# Patient Record
Sex: Female | Born: 1960 | Marital: Married | Smoking: Never smoker
Health system: Southern US, Community
[De-identification: ages and names within clinical notes are randomized; demographics above are authoritative.]

## PROBLEM LIST (undated history)

## (undated) DIAGNOSIS — I2699 Other pulmonary embolism without acute cor pulmonale: Secondary | ICD-10-CM

## (undated) DIAGNOSIS — T7840XA Allergy, unspecified, initial encounter: Secondary | ICD-10-CM

## (undated) DIAGNOSIS — I82409 Acute embolism and thrombosis of unspecified deep veins of unspecified lower extremity: Secondary | ICD-10-CM

## (undated) DIAGNOSIS — G43109 Migraine with aura, not intractable, without status migrainosus: Secondary | ICD-10-CM

## (undated) HISTORY — DX: Migraine with aura, not intractable, without status migrainosus: G43.109

## (undated) HISTORY — PX: TUBAL LIGATION: SHX77

## (undated) HISTORY — PX: COLONOSCOPY: SHX174

## (undated) HISTORY — DX: Allergy, unspecified, initial encounter: T78.40XA

---

## 1996-06-19 DIAGNOSIS — G43109 Migraine with aura, not intractable, without status migrainosus: Secondary | ICD-10-CM

## 1996-06-19 HISTORY — DX: Migraine with aura, not intractable, without status migrainosus: G43.109

## 2003-06-20 LAB — HM MAMMOGRAPHY: HM Mammogram: NORMAL

## 2004-04-07 ENCOUNTER — Other Ambulatory Visit: Admission: RE | Admit: 2004-04-07 | Discharge: 2004-04-07 | Payer: Self-pay | Admitting: Gynecology

## 2006-06-19 HISTORY — PX: NEPHRECTOMY: SHX65

## 2011-01-26 ENCOUNTER — Ambulatory Visit (INDEPENDENT_AMBULATORY_CARE_PROVIDER_SITE_OTHER): Payer: Self-pay | Admitting: Internal Medicine

## 2011-01-26 ENCOUNTER — Encounter: Payer: Self-pay | Admitting: Internal Medicine

## 2011-01-26 ENCOUNTER — Other Ambulatory Visit (INDEPENDENT_AMBULATORY_CARE_PROVIDER_SITE_OTHER): Payer: Self-pay

## 2011-01-26 VITALS — BP 116/70 | HR 86 | Temp 98.6°F | Resp 16 | Ht 61.0 in | Wt 161.0 lb

## 2011-01-26 DIAGNOSIS — G43909 Migraine, unspecified, not intractable, without status migrainosus: Secondary | ICD-10-CM

## 2011-01-26 DIAGNOSIS — R5383 Other fatigue: Secondary | ICD-10-CM | POA: Insufficient documentation

## 2011-01-26 LAB — CBC WITH DIFFERENTIAL/PLATELET
Basophils Absolute: 0 10*3/uL (ref 0.0–0.1)
Eosinophils Absolute: 0.4 10*3/uL (ref 0.0–0.7)
Hemoglobin: 11.6 g/dL — ABNORMAL LOW (ref 12.0–15.0)
Lymphocytes Relative: 30.3 % (ref 12.0–46.0)
Lymphs Abs: 2.3 10*3/uL (ref 0.7–4.0)
MCHC: 32.5 g/dL (ref 30.0–36.0)
Neutro Abs: 4.1 10*3/uL (ref 1.4–7.7)
RDW: 14.1 % (ref 11.5–14.6)

## 2011-01-26 LAB — COMPREHENSIVE METABOLIC PANEL
AST: 16 U/L (ref 0–37)
Alkaline Phosphatase: 44 U/L (ref 39–117)
BUN: 12 mg/dL (ref 6–23)
Creatinine, Ser: 1 mg/dL (ref 0.4–1.2)
Potassium: 3.8 mEq/L (ref 3.5–5.1)
Total Bilirubin: 0.4 mg/dL (ref 0.3–1.2)

## 2011-01-26 MED ORDER — ELETRIPTAN HYDROBROMIDE 40 MG PO TABS: 40.0000 mg | ORAL_TABLET | ORAL | Status: DC | PRN

## 2011-01-26 NOTE — Patient Instructions (Signed)
Migraine Headache   A migraine headache is an intense, throbbing pain on one or both sides of your head. The exact cause of a migraine headache is not always known. A migraine may be caused when nerves in the brain become irritated and release chemicals that cause swelling (inflammation) within blood vessels, causing pain. Many migraine sufferers have a family history of migraines. Before you get a migraine you may or may not get an aura. An aura is a group of symptoms that can predict the beginning of a migraine. An aura may include:   Visual changes such as:   Flashing lights.   Seeing bright spots or zig-zag lines.   Tunnel vision.   Feelings of numbness.   Trouble talking.   Muscle weakness.   SYMPTOMS OF A MIGRAINE   A migraine headache has one or more of the following symptoms:   Pain on one or both sides of your head.   Pain that is pulsating or throbbing in nature.   Pain that is severe enough to prevent daily activities.   Pain that is aggravated by any daily physical activity.   Nausea (feeling sick to your stomach), vomiting or both.   Pain with exposure to bright lights, loud noises or activity.   General sensitivity to bright lights or loud noises.   MIGRAINE TRIGGERS   A migraine headache can be triggered by many things. Examples of triggers include:   Alcohol.   Smoking.   Stress.   It may be related to menses (female menstruation).   Aged cheeses.   Foods or drinks that contain nitrates, glutamate, aspartame or tyramine.   Lack of sleep.   Chocolate.   Caffeine.   Hunger.   Medications such as nitroglycerine (used to treat chest pain), birth control pills, estrogen and some blood pressure medications.   DIAGNOSIS   A migraine headache is often diagnosed based on:   Your symptoms.   Physical examination.   A CT scan of your head may be ordered to see if your headaches are caused from other medical conditions.   HOME CARE INSTRUCTIONS   Medications can help prevent migraines if they are recurrent or  should they become recurrent. Your caregiver can help you with a medication or treatment program that will be helpful to you.   If you get a migraine, it may be helpful to lie down in a dark, quiet room.   It may be helpful to keep a headache diary. This may help you find a trend as to what may be triggering your headaches.   SEEK IMMEDIATE MEDICAL CARE IF:   You do not get relief from the medications given to you or you have a recurrence of pain.   You have confusion, personality changes or seizures.   You have headaches that wake you from sleep.   You have an increased frequency in your headaches.   You have a stiff neck.   You have a loss of vision.   You have muscle weakness.   You start losing your balance or have trouble walking.   You feel faint or pass out.   MAKE SURE YOU:   Understand these instructions.   Will watch your condition.   Will get help right away if you are not doing well or get worse.   Document Released: 06/05/2005 Document Re-Released: 04/02/2009   [employer]® Patient Information ©2011 [employer].

## 2011-01-27 ENCOUNTER — Encounter: Payer: Self-pay | Admitting: Internal Medicine

## 2011-01-27 NOTE — Assessment & Plan Note (Signed)
Try relpax 

## 2011-01-27 NOTE — Assessment & Plan Note (Signed)
I will check labs to look for secondary causes 

## 2011-01-27 NOTE — Progress Notes (Signed)
Subjective:    Patient ID: Christy [name], female    DOB: 02-07-1961, 50 y.o.   MRN: [id number]  Headache  This is a chronic problem. The current episode started more than 1 year ago. The problem occurs intermittently. The problem has been unchanged. The pain is located in the bilateral region. The pain does not radiate. The pain quality is similar to prior headaches. The quality of the pain is described as pulsating. The pain is at a severity of 7/10. The pain is moderate. Pertinent negatives include no abdominal pain, abnormal behavior, anorexia, back pain, blurred vision, coughing, dizziness, drainage, ear pain, eye pain, eye redness, eye watering, facial sweating, fever, hearing loss, insomnia, loss of balance, muscle aches, nausea, neck pain, numbness, phonophobia, photophobia, rhinorrhea, scalp tenderness, seizures, sinus pressure, sore throat, swollen glands, tingling, tinnitus, visual change, vomiting, weakness or weight loss. The symptoms are aggravated by weather changes and emotional stress. She has tried triptans for the symptoms. The treatment provided significant relief. Her past medical history is significant for migraine headaches, migraines in the family and obesity. There is no history of cancer, cluster headaches, hypertension, immunosuppression, pseudotumor cerebri, recent head traumas, sinus disease or TMJ.      Review of Systems  Constitutional: Positive for fatigue and unexpected weight change (weight gain). Negative for fever, chills, weight loss, diaphoresis, activity change and appetite change.  HENT: Negative for hearing loss, ear pain, nosebleeds, congestion, sore throat, facial swelling, rhinorrhea, sneezing, drooling, mouth sores, trouble swallowing, neck pain, neck stiffness, dental problem, voice change, postnasal drip, sinus pressure, tinnitus and ear discharge.   Eyes: Negative for blurred vision, photophobia, pain, discharge, redness, itching and visual disturbance.    Respiratory: Negative for apnea, cough, choking, chest tightness, shortness of breath, wheezing and stridor.   Cardiovascular: Negative for chest pain, palpitations and leg swelling.  Gastrointestinal: Positive for diarrhea and constipation. Negative for nausea, vomiting, abdominal pain, abdominal distention, rectal pain and anorexia.  Genitourinary: Negative for dysuria, urgency, frequency, hematuria, flank pain, vaginal bleeding, enuresis, difficulty urinating and dyspareunia.  Musculoskeletal: Negative for myalgias, back pain, joint swelling, arthralgias and gait problem.  Skin: Negative for color change, pallor, rash and wound.  Neurological: Positive for headaches. Negative for dizziness, tingling, tremors, seizures, syncope, facial asymmetry, speech difficulty, weakness, light-headedness, numbness and loss of balance.  Hematological: Negative for adenopathy. Does not bruise/bleed easily.  Psychiatric/Behavioral: Negative.  The patient does not have insomnia.        Objective:   Physical Exam  Vitals reviewed. Constitutional: She appears well-developed and well-nourished. No distress.  HENT:  Head: Normocephalic and atraumatic.  Right Ear: External ear normal.  Left Ear: External ear normal.  Nose: Nose normal.  Mouth/Throat: Oropharynx is clear and moist. No oropharyngeal exudate.  Eyes: Conjunctivae and EOM are normal. Pupils are equal, round, and reactive to light. No scleral icterus.  Neck: Normal range of motion. Neck supple. No JVD present. No tracheal deviation present. No thyromegaly present.  Cardiovascular: Normal rate, regular rhythm, normal heart sounds and intact distal pulses.  Exam reveals no gallop and no friction rub.   No murmur heard. Pulmonary/Chest: Effort normal and breath sounds normal. No stridor. No respiratory distress. She has no wheezes. She has no rales. She exhibits no tenderness.  Abdominal: Soft. Bowel sounds are normal. She exhibits no distension and  no mass. There is no tenderness. There is no rebound and no guarding.  Musculoskeletal: Normal range of motion. She exhibits no edema and no tenderness.  Lymphadenopathy:  She has no cervical adenopathy.  Neurological: She is alert. She has normal strength. She displays no atrophy, no tremor and normal reflexes. No cranial nerve deficit or sensory deficit. She exhibits normal muscle tone. She displays a negative Romberg sign. She displays no seizure activity. Coordination and gait normal.  Reflex Scores:      Tricep reflexes are 1+ on the right side and 1+ on the left side.      Bicep reflexes are 1+ on the right side and 1+ on the left side.      Brachioradialis reflexes are 1+ on the right side and 1+ on the left side.      Patellar reflexes are 1+ on the right side and 1+ on the left side.      Achilles reflexes are 1+ on the right side and 1+ on the left side. Skin: Skin is warm and dry. No rash noted. She is not diaphoretic. No erythema. No pallor.  Psychiatric: She has a normal mood and affect. Her behavior is normal. Judgment and thought content normal.          Assessment & Plan:

## 2011-01-28 ENCOUNTER — Encounter: Payer: Self-pay | Admitting: Internal Medicine

## 2011-05-23 ENCOUNTER — Encounter: Payer: Self-pay | Admitting: Internal Medicine

## 2011-05-23 ENCOUNTER — Ambulatory Visit (INDEPENDENT_AMBULATORY_CARE_PROVIDER_SITE_OTHER): Payer: BC Managed Care – PPO | Admitting: Internal Medicine

## 2011-05-23 VITALS — BP 114/68 | HR 88 | Temp 98.2°F | Resp 16 | Wt 165.0 lb

## 2011-05-23 DIAGNOSIS — J209 Acute bronchitis, unspecified: Secondary | ICD-10-CM | POA: Insufficient documentation

## 2011-05-23 MED ORDER — MOXIFLOXACIN HCL 400 MG PO TABS: 400.0000 mg | ORAL_TABLET | Freq: Every day | ORAL | Status: AC

## 2011-05-23 MED ORDER — GUAIFENESIN-CODEINE 100-10 MG/5ML PO SYRP: 5.0000 mL | ORAL_SOLUTION | Freq: Three times a day (TID) | ORAL | Status: AC | PRN

## 2011-05-23 NOTE — Assessment & Plan Note (Signed)
Start avelox for the infection and robitussin AC for the cough

## 2011-05-23 NOTE — Progress Notes (Signed)
  Subjective:    Patient ID: Christy [name], female    DOB: May 19, 1961, 50 y.o.   MRN: [id number]  Cough This is a new problem. Episode onset: for 4 weeks. The problem has been gradually worsening. The problem occurs every few hours. The cough is productive of purulent sputum. Associated symptoms include chills. Pertinent negatives include no chest pain, ear congestion, ear pain, fever, headaches, heartburn, hemoptysis, myalgias, nasal congestion, postnasal drip, rash, rhinorrhea, sore throat, shortness of breath, sweats, weight loss or wheezing. The symptoms are aggravated by nothing. She has tried OTC cough suppressant for the symptoms. The treatment provided no relief.      Review of Systems  Constitutional: Positive for chills. Negative for fever, weight loss, diaphoresis, activity change, appetite change, fatigue and unexpected weight change.  HENT: Negative for ear pain, sore throat, rhinorrhea, trouble swallowing, voice change, postnasal drip and sinus pressure.   Eyes: Negative.   Respiratory: Positive for cough. Negative for hemoptysis, shortness of breath, wheezing and stridor.   Cardiovascular: Negative for chest pain, palpitations and leg swelling.  Gastrointestinal: Negative for heartburn, nausea, abdominal pain, diarrhea, constipation, blood in stool and abdominal distention.  Genitourinary: Negative for dysuria, urgency, frequency, hematuria, decreased urine volume, enuresis, difficulty urinating and dyspareunia.  Musculoskeletal: Negative for myalgias, back pain, joint swelling, arthralgias and gait problem.  Skin: Negative for color change, pallor, rash and wound.  Neurological: Negative for dizziness, tremors, seizures, syncope, facial asymmetry, speech difficulty, weakness, light-headedness, numbness and headaches.  Hematological: Negative for adenopathy. Does not bruise/bleed easily.  Psychiatric/Behavioral: Negative.        Objective:   Physical Exam  Vitals  reviewed. Constitutional: She is oriented to person, place, and time. She appears well-developed and well-nourished.  HENT:  Head: Normocephalic and atraumatic.  Mouth/Throat: Oropharynx is clear and moist. No oropharyngeal exudate.  Eyes: Conjunctivae are normal. Right eye exhibits no discharge. Left eye exhibits no discharge. No scleral icterus.  Neck: Normal range of motion. Neck supple. No JVD present. No tracheal deviation present. No thyromegaly present.  Cardiovascular: Normal rate, regular rhythm, normal heart sounds and intact distal pulses.  Exam reveals no gallop and no friction rub.   No murmur heard. Pulmonary/Chest: Effort normal and breath sounds normal. No stridor. No respiratory distress. She has no wheezes. She has no rales. She exhibits no tenderness.  Abdominal: Soft. Bowel sounds are normal. She exhibits no distension and no mass. There is no tenderness. There is no rebound and no guarding.  Musculoskeletal: Normal range of motion. She exhibits no edema and no tenderness.  Lymphadenopathy:    She has no cervical adenopathy.  Neurological: She is oriented to person, place, and time.  Skin: Skin is warm and dry. No rash noted. She is not diaphoretic. No erythema. No pallor.  Psychiatric: She has a normal mood and affect. Her behavior is normal. Judgment and thought content normal.          Assessment & Plan:

## 2011-05-23 NOTE — Patient Instructions (Signed)

## 2012-03-22 ENCOUNTER — Emergency Department (HOSPITAL_COMMUNITY)
Admission: EM | Admit: 2012-03-22 | Discharge: 2012-03-22 | Disposition: A | Discharge status: Home / Self Care | Payer: BC Managed Care – PPO | Attending: Emergency Medicine | Admitting: Emergency Medicine

## 2012-03-22 ENCOUNTER — Encounter (HOSPITAL_COMMUNITY): Payer: Self-pay | Admitting: Emergency Medicine

## 2012-03-22 ENCOUNTER — Emergency Department (HOSPITAL_COMMUNITY): Payer: BC Managed Care – PPO

## 2012-03-22 DIAGNOSIS — R0789 Other chest pain: Secondary | ICD-10-CM

## 2012-03-22 DIAGNOSIS — R200 Anesthesia of skin: Secondary | ICD-10-CM

## 2012-03-22 DIAGNOSIS — G43909 Migraine, unspecified, not intractable, without status migrainosus: Secondary | ICD-10-CM

## 2012-03-22 DIAGNOSIS — R209 Unspecified disturbances of skin sensation: Secondary | ICD-10-CM | POA: Insufficient documentation

## 2012-03-22 LAB — CBC
Hemoglobin: 11.7 g/dL — ABNORMAL LOW (ref 12.0–15.0)
MCH: 24.9 pg — ABNORMAL LOW (ref 26.0–34.0)
MCV: 76.8 fL — ABNORMAL LOW (ref 78.0–100.0)
Platelets: 358 10*3/uL (ref 150–400)
RBC: 4.69 MIL/uL (ref 3.87–5.11)
WBC: 7 10*3/uL (ref 4.0–10.5)

## 2012-03-22 LAB — BASIC METABOLIC PANEL
CO2: 27 mEq/L (ref 19–32)
Calcium: 9.7 mg/dL (ref 8.4–10.5)
Chloride: 107 mEq/L (ref 96–112)
Creatinine, Ser: 1.16 mg/dL — ABNORMAL HIGH (ref 0.50–1.10)
Glucose, Bld: 90 mg/dL (ref 70–99)
Sodium: 142 mEq/L (ref 135–145)

## 2012-03-22 LAB — GLUCOSE, CAPILLARY

## 2012-03-22 LAB — POCT I-STAT TROPONIN I
Troponin i, poc: 0 ng/mL (ref 0.00–0.08)
Troponin i, poc: 0.01 ng/mL (ref 0.00–0.08)

## 2012-03-22 NOTE — ED Provider Notes (Signed)
History     CSN: [id number]  Arrival date & time 03/22/12  1220   First MD Initiated Contact with Patient 03/22/12 1416      Chief Complaint  Patient presents with  . Chest Pain  . Numbness    (Consider location/radiation/quality/duration/timing/severity/associated sxs/prior treatment) HPI CC: CP and numbness in hand  CP: Started yesterday in am. On and off during the day. Worse w/ deep inspirations. Sharp and located in middle of chest w/o radiation. Mild SOB but denies palpitations, dizziness, syncope, diaphoresis. Endorses HA but pt reports daily migraines for nearly 52yrs. Has previously had CP that received workup for years ago that was normal per pt.  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 Persisted during work today w/ decreased perceived strength in fingers on R. Denies slurring of speech, confusion, syncope, difficulty walking/balance.  Daily migraines for 45yrs treated primarily w/ tylenol. No tylenol since Sunday.      Past Medical History  Diagnosis Date  . 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 Hypertension Mother   . Cancer Father   . Prostate cancer Father   . Hypertension Father   . Lupus Daughter     History  Substance Use Topics  . Smoking status: Never Smoker   . Smokeless tobacco: Not on file  . Alcohol Use: 9.0 oz/week    15 Glasses of wine per week     wine    OB History    Grav Para Term Preterm Abortions TAB SAB Ect Mult Living                  Review of Systems  All other systems reviewed and are negative.    Allergies  Penicillins  Home Medications   Current Outpatient Rx  Name Route Sig Dispense Refill  . ACETAMINOPHEN 500 MG PO TABS Oral Take 500 mg by mouth every 6 (six) hours as needed. For pain    . ASPIRIN 325 MG PO TBEC Oral Take 325 mg by mouth once.       BP 122/69  Pulse 87  Temp 98.5 F (36.9 C) (Oral)  Resp 18  SpO2 98%  Physical Exam  Constitutional: She is oriented to person, place, and time. She appears well-developed and well-nourished. No distress.  HENT:  Head: Normocephalic.  Neck: Normal range of motion.  Cardiovascular: Normal rate, regular rhythm, normal heart sounds and intact distal pulses.   Pulmonary/Chest: Effort normal and breath sounds normal.  Abdominal: Soft. She exhibits no distension.  Musculoskeletal: Normal range of motion. She exhibits tenderness. She exhibits no edema.       CP reproducible on palpation  Lymphadenopathy:    She has no cervical adenopathy.  Neurological: She is alert and oriented to person, place, and time. No cranial nerve deficit. Coordination normal.       Sensation intact and symmetrical in hands bilaterally. Strength symmetrical in hands  Skin: Skin is warm and dry. She is not diaphoretic.  Psychiatric: She has a normal mood and affect. Her behavior is normal.      ED Course  Procedures (including critical care time)  Labs Reviewed  CBC - Abnormal; Notable for the following:    Hemoglobin 11.7 (*)     MCV 76.8 (*)     MCH 24.9 (*)  All other components within normal limits  BASIC METABOLIC PANEL - Abnormal; Notable for the following:    Creatinine, Ser 1.16 (*)     GFR calc non Af Amer 53 (*)     GFR calc Af Amer 61 (*)     All other components within normal limits  POCT I-STAT TROPONIN I   Dg Chest 2 View  03/22/2012  *RADIOLOGY REPORT*  Clinical Data: Chest pain and numbness  CHEST - 1 VIEW  Comparison:  None.  Findings: The heart size and mediastinal contours are within normal limits.  Both lungs are clear.  There is no pleural effusion or pneumothorax. Trachea is midline. No acute bony abnormality.  Two surgical clips seen near the midline of the upper abdomen.  IMPRESSION: No acute cardiopulmonary disease.   Original Report Authenticated By: Britta Mccreedy, M.D.        No diagnosis found.   ED Workup - Troponins Neg x2 - VS stable - EKG normal x2 - Ambulation w/o symptoms - HA improved w/ Tylenol - CXR, CBC unremarkable  - Cr elevated though pt w/ one kidney - HEART Score 1-2      MDM   51yo F w/ musculoskeletal/pleuritic CP and sensory change in hand that is likely related to migraines. Pt comfortable and anxious to leave. Risks factors discussed  - Pt to f/u w/ PCP - Handout given        Ozella Rocks, MD 03/22/12 1503  Ozella Rocks, MD 03/22/12 1505  Ozella Rocks, MD 04/05/12 [phone]

## 2012-03-22 NOTE — ED Notes (Signed)
Pt c/o tingling in right arm upon waking up this and some pain in left chest yesterday; pt sts SOB with exertion

## 2012-04-06 NOTE — ED Provider Notes (Signed)
I have personally seen and examined the patient.  I have discussed the plan of care with the resident.  I have reviewed the documentation on PMH/FH/Soc. History.  I have reviewed the documentation of the resident and agree.   Pt seen/examined, suspicion for ACS/PE/Dissection is low Stable for d/c   Date: 03/22/2012  Rate: 80  Rhythm: normal sinus rhythm  QRS Axis: normal  Intervals: normal  ST/T Wave abnormalities: normal  Conduction Disutrbances:none     Joya Gaskins, MD 04/06/12 [phone]

## 2014-10-30 ENCOUNTER — Ambulatory Visit (INDEPENDENT_AMBULATORY_CARE_PROVIDER_SITE_OTHER): Payer: Self-pay | Admitting: Family Medicine

## 2014-10-30 VITALS — BP 118/70 | HR 74 | Temp 98.3°F | Resp 16 | Ht 62.0 in | Wt 168.0 lb

## 2014-10-30 DIAGNOSIS — N950 Postmenopausal bleeding: Secondary | ICD-10-CM

## 2014-10-30 DIAGNOSIS — R300 Dysuria: Secondary | ICD-10-CM

## 2014-10-30 DIAGNOSIS — Z905 Acquired absence of kidney: Secondary | ICD-10-CM

## 2014-10-30 DIAGNOSIS — Z841 Family history of disorders of kidney and ureter: Secondary | ICD-10-CM

## 2014-10-30 DIAGNOSIS — R5383 Other fatigue: Secondary | ICD-10-CM

## 2014-10-30 LAB — POCT URINALYSIS DIPSTICK
BILIRUBIN UA: NEGATIVE
Glucose, UA: NEGATIVE
KETONES UA: NEGATIVE
NITRITE UA: NEGATIVE
Protein, UA: NEGATIVE
Spec Grav, UA: 1.015
UROBILINOGEN UA: 0.2
pH, UA: 5.5

## 2014-10-30 LAB — TSH: TSH: 1.597 u[IU]/mL (ref 0.350–4.500)

## 2014-10-30 LAB — COMPREHENSIVE METABOLIC PANEL
ALBUMIN: 4 g/dL (ref 3.5–5.2)
ALT: 12 U/L (ref 0–35)
AST: 14 U/L (ref 0–37)
Alkaline Phosphatase: 53 U/L (ref 39–117)
BUN: 18 mg/dL (ref 6–23)
CO2: 26 mEq/L (ref 19–32)
CREATININE: 1.06 mg/dL (ref 0.50–1.10)
Calcium: 9.3 mg/dL (ref 8.4–10.5)
Chloride: 104 mEq/L (ref 96–112)
GLUCOSE: 80 mg/dL (ref 70–99)
Potassium: 3.9 mEq/L (ref 3.5–5.3)
Sodium: 140 mEq/L (ref 135–145)
Total Bilirubin: 0.2 mg/dL (ref 0.2–1.2)
Total Protein: 7.4 g/dL (ref 6.0–8.3)

## 2014-10-30 LAB — POCT UA - MICROSCOPIC ONLY
Bacteria, U Microscopic: NEGATIVE
CASTS, UR, LPF, POC: NEGATIVE
Crystals, Ur, HPF, POC: NEGATIVE
MUCUS UA: NEGATIVE
RBC, URINE, MICROSCOPIC: NEGATIVE
YEAST UA: NEGATIVE

## 2014-10-30 MED ORDER — CIPROFLOXACIN HCL 500 MG PO TABS: 500.0000 mg | ORAL_TABLET | Freq: Two times a day (BID) | ORAL | Status: DC

## 2014-10-30 NOTE — Patient Instructions (Addendum)
Use the cipro twice a day for UTI I will be in touch with the rest of your labs Continue to drink plenty of water, let me know if you are not improved in 1-2 days  I will refer you to an OBG to make sure that episode of bleeding was nothing to worry about

## 2014-10-30 NOTE — Progress Notes (Signed)
[facility] [address], [place] [address] [phone]  Date:  10/30/2014   Name:  Christy [name]   DOB:  [birth year]   MRN:  [id number]  PCP:  Scarlette Calico, MD    Chief Complaint: Foot Swelling; Fatigue; and Urine Discoloration   History of Present Illness:  Christy [name] is a 54 y.o. 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  Sadly her daughter has continued to have a lot of health problems and has required another transplant  She has noted her urine issue for 7-9 days.   She does have dysuria- not that severe.   She has not noted any blood in her urine, but the urine is "cloudy yellow."   She notes urgency.   She has noted some lower belly pain.  She has noted a little weight gain recently which is getting on her nerves.  Also she has noted some swelling in her feet  She has 2 daughters with lupus and kidney problems, and her sister died due to a stoke while on dialysis 5 months ago Her cousin who lives in [place] also has renal failure.  She is understandably concerned about her kidney health   She has been menopausal for about 2 years- however in January she had a day of "heavy spotting," this has not come back and she was not sure if it was of concern  Patient Active Problem List   Diagnosis Date Noted  . Acute bronchitis 05/23/2011  . Migraine headache 01/26/2011  . Fatigue 01/26/2011    Past Medical History  Diagnosis Date  . 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 Tubal ligation      History  Substance Use Topics  . Smoking status: Never Smoker   . Smokeless tobacco: Not on file  . Alcohol Use: 9.0 oz/week    15 Glasses  of wine per week     Comment: wine    Family History  Problem Relation Age of Onset  . Hypertension Mother   . Heart disease Mother   . Stroke Mother   . Cancer Father   . Prostate cancer Father   . Hypertension Father   . Lupus Daughter     Allergies  Allergen Reactions  . Penicillins Hives    Medication list has been reviewed and updated.  No current outpatient prescriptions on file prior to visit.   No current facility-administered medications on file prior to visit.    Review of Systems:  As per HPI- otherwise negative.   Physical Examination: Filed Vitals:   10/30/14 1239  BP: 118/70  Pulse: 74  Temp: 98.3 F (36.8 C)  Resp: 16   Filed Vitals:   10/30/14 1239  Height: 5\' 2"  (1.575 m)  Weight: 168 lb (76.204 kg)   Body mass index is 30.72 kg/(m^2). Ideal Body Weight: Weight in (lb) to have BMI = 25: 136.4  GEN: WDWN, NAD, Non-toxic, A & O x 3, looks well, overweight HEENT: Atraumatic, Normocephalic. Neck supple. No masses, No LAD. Ears and Nose: No external deformity. CV: RRR, No M/G/R. No JVD. No thrill. No extra heart sounds.  PULM: CTA B, no wheezes, crackles, rhonchi. No retractions. No resp. distress. No accessory muscle use. ABD: S, ND, +BS. No rebound. No HSM.  Mild suprapubic tenderness to palp. Mild right CVA tenderness  EXTR: No c/c/e NEURO Normal gait.  PSYCH: Normally interactive. Conversant. Not depressed or anxious appearing.  Calm demeanor.   Results for orders placed or performed in visit on 10/30/14  POCT UA - Microscopic Only  Result Value Ref Range   WBC, Ur, HPF, POC 25-30    RBC, urine, microscopic neg    Bacteria, U Microscopic neg    Mucus, UA neg    Epithelial cells, urine per micros 0-2    Crystals, Ur, HPF, POC neg    Casts, Ur, LPF, POC neg    Yeast, UA neg   POCT urinalysis dipstick  Result Value Ref Range   Color, UA yellow    Clarity, UA cloudy    Glucose, UA neg    Bilirubin, UA neg    Ketones, UA neg     Spec Grav, UA 1.015    Blood, UA trace-lysed    pH, UA 5.5    Protein, UA neg    Urobilinogen, UA 0.2    Nitrite, UA neg    Leukocytes, UA small (1+)     Assessment and Plan: Dysuria - Plan: POCT UA - Microscopic Only, POCT urinalysis dipstick, Urine culture, ciprofloxacin (CIPRO) 500 MG tablet  Family history of renal failure - Plan: Comprehensive metabolic panel  Solitary kidney, acquired - Plan: Comprehensive metabolic panel  Other fatigue - Plan: Comprehensive metabolic panel, TSH  Post-menopausal bleeding - Plan: Ambulatory referral to Obstetrics / Gynecology  UTI- possible early pyelo.  Start treatment with cipro 500 BID for one week.   Await the rest of her labs She will report if any worsening and I will make sure her renal function is ok Referral to OBG for episode of post- menopausal bleeding   Signed Lamar Blinks, MD

## 2014-11-01 LAB — URINE CULTURE

## 2014-11-02 ENCOUNTER — Encounter: Payer: Self-pay | Admitting: Family Medicine

## 2014-11-05 ENCOUNTER — Telehealth: Payer: Self-pay

## 2014-11-05 NOTE — Telephone Encounter (Signed)
Pt called inquiring about TSH results. Let her know what it was

## 2014-11-27 ENCOUNTER — Encounter: Payer: Self-pay | Admitting: Gynecology

## 2014-11-27 ENCOUNTER — Other Ambulatory Visit (HOSPITAL_COMMUNITY)
Admission: RE | Admit: 2014-11-27 | Discharge: 2014-11-27 | Disposition: A | Discharge status: Home / Self Care | Payer: Self-pay | Source: Ambulatory Visit | Attending: Gynecology | Admitting: Gynecology

## 2014-11-27 ENCOUNTER — Ambulatory Visit (INDEPENDENT_AMBULATORY_CARE_PROVIDER_SITE_OTHER): Payer: Self-pay | Admitting: Gynecology

## 2014-11-27 VITALS — BP 124/80 | Ht 62.0 in | Wt 163.0 lb

## 2014-11-27 DIAGNOSIS — Z01419 Encounter for gynecological examination (general) (routine) without abnormal findings: Secondary | ICD-10-CM

## 2014-11-27 DIAGNOSIS — Z1151 Encounter for screening for human papillomavirus (HPV): Secondary | ICD-10-CM | POA: Insufficient documentation

## 2014-11-27 DIAGNOSIS — N950 Postmenopausal bleeding: Secondary | ICD-10-CM

## 2014-11-27 NOTE — Addendum Note (Signed)
Addended by: Burnett Kanaris on: 11/27/2014 12:49 PM   Modules accepted: Orders

## 2014-11-27 NOTE — Patient Instructions (Signed)
Endometrial Biopsy Endometrial biopsy is a procedure in which a tissue sample is taken from inside the uterus. The tissue sample is then looked at under a microscope to see if the tissue is normal or abnormal. The endometrium is the lining of the uterus. This procedure helps determine where you are in your menstrual cycle and how hormone levels are affecting the lining of the uterus. This procedure may also be used to evaluate uterine bleeding or to diagnose endometrial cancer, tuberculosis, polyps, or inflammatory conditions.  LET [facility] CARE PROVIDER KNOW ABOUT:  Any allergies you have.  All medicines you are taking, including vitamins, herbs, eye drops, creams, and over-the-counter medicines.  Previous problems you or members of your family have had with the use of anesthetics.  Any blood disorders you have.  Previous surgeries you have had.  Medical conditions you have.  Possibility of pregnancy. RISKS AND COMPLICATIONS Generally, this is a safe procedure. However, as with any procedure, complications can occur. Possible complications include:  Bleeding.  Pelvic infection.  Puncture of the uterine wall with the biopsy device (rare). BEFORE THE PROCEDURE   Keep a record of your menstrual cycles as directed by your health care provider. You may need to schedule your procedure for a specific time in your cycle.  You may want to bring a sanitary pad to wear home after the procedure.  Arrange for someone to drive you home after the procedure if you will be given a medicine to help you relax (sedative). PROCEDURE   You may be given a sedative to relax you.  You will lie on an exam table with your feet and legs supported as in a pelvic exam.  Your health care provider will insert an instrument (speculum) into your vagina to see your cervix.  Your cervix will be cleansed with an antiseptic solution. A medicine (local anesthetic) will be used to numb the cervix.  A forceps  instrument (tenaculum) will be used to hold your cervix steady for the biopsy.  A thin, rodlike instrument (uterine sound) will be inserted through your cervix to determine the length of your uterus and the location where the biopsy sample will be removed.  A thin, flexible tube (catheter) will be inserted through your cervix and into the uterus. The catheter is used to collect the biopsy sample from your endometrial tissue.  The catheter and speculum will then be removed, and the tissue sample will be sent to a lab for examination. AFTER THE PROCEDURE  You will rest in a recovery area until you are ready to go home.  You may have mild cramping and a small amount of vaginal bleeding for a few days after the procedure. This is normal.  Make sure you find out how to get your test results. Document Released: 10/06/2004 Document Revised: 02/05/2013 Document Reviewed: 11/20/2012 [facility] Patient Information 2015 [place], [place]. This information is not intended to replace advice given to you by your health care provider. Make sure you discuss any questions you have with your health care provider. Colonoscopy A colonoscopy is an exam to look at the entire large intestine (colon). This exam can help find problems such as tumors, polyps, inflammation, and areas of bleeding. The exam takes about 1 hour.  LET [facility] CARE PROVIDER KNOW ABOUT:   Any allergies you have.  All medicines you are taking, including vitamins, herbs, eye drops, creams, and over-the-counter medicines.  Previous problems you or members of your family have had with the use  of anesthetics.  Any blood disorders you have.  Previous surgeries you have had.  Medical conditions you have. RISKS AND COMPLICATIONS  Generally, this is a safe procedure. However, as with any procedure, complications can occur. Possible complications include:  Bleeding.  Tearing or rupture of the colon wall.  Reaction to medicines given  during the exam.  Infection (rare). BEFORE THE PROCEDURE   Ask your health care provider about changing or stopping your regular medicines.  You may be prescribed an oral bowel prep. This involves drinking a large amount of medicated liquid, starting the day before your procedure. The liquid will cause you to have multiple loose stools until your stool is almost clear or light green. This cleans out your colon in preparation for the procedure.  Do not eat or drink anything else once you have started the bowel prep, unless your health care provider tells you it is safe to do so.  Arrange for someone to drive you home after the procedure. PROCEDURE   You will be given medicine to help you relax (sedative).  You will lie on your side with your knees bent.  A long, flexible tube with a light and camera on the end (colonoscope) will be inserted through the rectum and into the colon. The camera sends video back to a computer screen as it moves through the colon. The colonoscope also releases carbon dioxide gas to inflate the colon. This helps your health care provider see the area better.  During the exam, your health care provider may take a small tissue sample (biopsy) to be examined under a microscope if any abnormalities are found.  The exam is finished when the entire colon has been viewed. AFTER THE PROCEDURE   Do not drive for 24 hours after the exam.  You may have a small amount of blood in your stool.  You may pass moderate amounts of gas and have mild abdominal cramping or bloating. This is caused by the gas used to inflate your colon during the exam.  Ask when your test results will be ready and how you will get your results. Make sure you get your test results. Document Released: 06/02/2000 Document Revised: 03/26/2013 Document Reviewed: 02/10/2013 [facility] Patient Information 2015 [place], [place]. This information is not intended to replace advice given to you by your health  care provider. Make sure you discuss any questions you have with your health care provider.

## 2014-11-27 NOTE — Progress Notes (Signed)
Christy [name] 1961-02-25 [id number]   History:    54 y.o.  for annual gyn exam who has not been seen in the office since 2005. Patient recently saw Dr. Lorelei Pont who had referred her to the office because she stated that she had postmenopausal bleeding for one day back in February. Patient stated that she's never been on hormone replacement therapy. She reached the menopause at the age of 28. She was asymptomatic today. She has not had a baseline colonoscopy yet. Her last mammogram was in 2005. She reports no past history of any abnormal Pap smears in the past. She did state that in [place] in the year 2013 she had a normal bone density study.  Past medical history,surgical history, family history and social history were all reviewed and documented in the EPIC chart.  Gynecologic History No LMP recorded. Patient is not currently having periods (Reason: Perimenopausal). Contraception: post menopausal status Last Pap: 2005. Results were: normal Last mammogram: 2005. Results were: normal  Obstetric History OB History  Gravida Para Term Preterm AB SAB TAB Ectopic Multiple Living  8 4 4  0 4 4  0  4    # Outcome Date GA Lbr Len/2nd Weight Sex Delivery Anes PTL Lv  8 SAB           7 Term           6 Term           5 Term           4 Term           3 SAB           2 SAB           1 SAB                ROS: A ROS was performed and pertinent positives and negatives are included in the history.  GENERAL: No fevers or chills. HEENT: No change in vision, no earache, sore throat or sinus congestion. NECK: No pain or stiffness. CARDIOVASCULAR: No chest pain or pressure. No palpitations. PULMONARY: No shortness of breath, cough or wheeze. GASTROINTESTINAL: No abdominal pain, nausea, vomiting or diarrhea, melena or bright red blood per rectum. GENITOURINARY: No urinary frequency, urgency, hesitancy or dysuria. MUSCULOSKELETAL: No joint or muscle pain, no back pain, no recent trauma. DERMATOLOGIC: No  rash, no itching, no lesions. ENDOCRINE: No polyuria, polydipsia, no heat or cold intolerance. No recent change in weight. HEMATOLOGICAL: No anemia or easy bruising or bleeding. NEUROLOGIC: No headache, seizures, numbness, tingling or weakness. PSYCHIATRIC: No depression, no loss of interest in normal activity or change in sleep pattern.     Exam: chaperone present  BP 124/80 mmHg  Ht 5\' 2"  (1.575 m)  Wt 163 lb (73.936 kg)  BMI 29.81 kg/m2  Body mass index is 29.81 kg/(m^2).  General appearance : Well developed well nourished female. No acute distress HEENT: Eyes: no retinal hemorrhage or exudates,  Neck supple, trachea midline, no carotid bruits, no thyroidmegaly Lungs: Clear to auscultation, no rhonchi or wheezes, or rib retractions  Heart: Regular rate and rhythm, no murmurs or gallops Breast:Examined in sitting and supine position were symmetrical in appearance, no palpable masses or tenderness,  no skin retraction, no nipple inversion, no nipple discharge, no skin discoloration, no axillary or supraclavicular lymphadenopathy Abdomen: no palpable masses or tenderness, no rebound or guarding Extremities: no edema or skin discoloration or tenderness  Pelvic:  Bartholin, Urethra, Skene Glands:  Within normal limits             Vagina: No gross lesions or discharge  Cervix: No gross lesions or discharge  Uterus  anteverted, normal size, shape and consistency, non-tender and mobile  Adnexa  Without masses or tenderness  Anus and perineum  normal   Rectovaginal  normal sphincter tone without palpated masses or tenderness             Hemoccult cards provided     Assessment/Plan:  54 y.o. female for annual exam with one isolated episode of postmenopausal bleeding for one day back in February. Patient states that she wouldn't like to wait and not have the biopsy today that I had recommended because she does not have insurance but promises to return back to the office if she has any  bleeding again. I did give her a requisition to schedule her mammogram which is overdue and recommended she do her monthly breast exam. Also given the name of community gastroenterologist for her to schedule her screening colonoscopy. A Pap smear was done today. Her PCP has done her blood work recently.   Terrance Mass MD, 12:21 PM 11/27/2014

## 2014-12-02 LAB — CYTOLOGY - PAP

## 2016-03-16 ENCOUNTER — Ambulatory Visit
Admission: RE | Admit: 2016-03-16 | Discharge: 2016-03-16 | Disposition: A | Discharge status: Home / Self Care | Payer: No Typology Code available for payment source | Source: Ambulatory Visit | Attending: Family Medicine | Admitting: Family Medicine

## 2016-03-16 ENCOUNTER — Other Ambulatory Visit: Payer: Self-pay | Admitting: Family Medicine

## 2016-03-16 DIAGNOSIS — R0989 Other specified symptoms and signs involving the circulatory and respiratory systems: Secondary | ICD-10-CM

## 2016-03-20 ENCOUNTER — Other Ambulatory Visit: Payer: Self-pay | Admitting: Family Medicine

## 2016-03-20 DIAGNOSIS — R590 Localized enlarged lymph nodes: Secondary | ICD-10-CM

## 2016-05-10 ENCOUNTER — Other Ambulatory Visit: Payer: Self-pay

## 2016-07-13 ENCOUNTER — Ambulatory Visit
Admission: RE | Admit: 2016-07-13 | Discharge: 2016-07-13 | Disposition: A | Discharge status: Home / Self Care | Payer: No Typology Code available for payment source | Source: Ambulatory Visit | Attending: Family Medicine | Admitting: Family Medicine

## 2016-07-13 DIAGNOSIS — R590 Localized enlarged lymph nodes: Secondary | ICD-10-CM

## 2016-08-23 ENCOUNTER — Ambulatory Visit (INDEPENDENT_AMBULATORY_CARE_PROVIDER_SITE_OTHER): Payer: Self-pay | Admitting: Internal Medicine

## 2016-08-23 ENCOUNTER — Ambulatory Visit (INDEPENDENT_AMBULATORY_CARE_PROVIDER_SITE_OTHER)
Admission: RE | Admit: 2016-08-23 | Discharge: 2016-08-23 | Disposition: A | Discharge status: Home / Self Care | Payer: Self-pay | Source: Ambulatory Visit | Attending: Internal Medicine | Admitting: Internal Medicine

## 2016-08-23 ENCOUNTER — Encounter: Payer: Self-pay | Admitting: Internal Medicine

## 2016-08-23 ENCOUNTER — Encounter (INDEPENDENT_AMBULATORY_CARE_PROVIDER_SITE_OTHER): Payer: Self-pay

## 2016-08-23 VITALS — BP 118/74 | HR 94 | Ht 61.0 in | Wt 165.6 lb

## 2016-08-23 DIAGNOSIS — R911 Solitary pulmonary nodule: Secondary | ICD-10-CM

## 2016-08-23 DIAGNOSIS — R0609 Other forms of dyspnea: Secondary | ICD-10-CM

## 2016-08-23 NOTE — Progress Notes (Signed)
Subjective:     Patient ID: Christy [name], female   DOB: [birth year],   MRN: [id number]  HPI   6 yobf from [place] never smoker Lots of passive smoke exp from husband but no other risk factors referred to pulmonary clinic 08/23/2016 by Dr   Christy [name] for [facility]    08/23/2016 1st [place] office visit/ Christy [name]   Chief Complaint  Patient presents with  . Pulmonary Consult    Referred by Dr. Yaakov [name] for eval of pulmonary nodule. She does c/o some SOB over the past year. She gets winded walking up stairs.   doe = MMRC1 = can walk nl pace, flat grade, can't hurry or go uphills or steps s sob, indolent onset, not really progressive  No obvious day to day or daytime variability or assoc excess/ purulent sputum or mucus plugs or hemoptysis or cp or chest tightness, subjective wheeze or overt sinus or hb symptoms. No unusual exp hx or h/o childhood pna/ asthma or knowledge of premature birth.  Sleeping ok without nocturnal  or early am exacerbation  of respiratory  c/o's or need for noct saba. Also denies any obvious fluctuation of symptoms with weather or environmental changes or other aggravating or alleviating factors except as outlined above   Current Medications, Allergies, Complete Past Medical History, Past Surgical History, Family History, and Social History were reviewed in [employer] record.  ROS  The following are not active complaints unless bolded sore throat, dysphagia, dental problems, itching, sneezing,  nasal congestion or excess/ purulent secretions, ear ache,   fever, chills, sweats, unintended wt loss, classically pleuritic or exertional cp,  orthopnea pnd or leg swelling, presyncope, palpitations, abdominal pain, anorexia, nausea, vomiting, diarrhea  or change in bowel or bladder habits, change in stools or urine, dysuria,hematuria,  rash, arthralgias, visual complaints, headache, numbness, weakness or ataxia or problems with walking or coordination,  change  in mood/affect or memory.          Review of Systems     Objective:   Physical Exam  Wt Readings from Last 3 Encounters:  08/23/16 165 lb 9.6 oz (75.1 kg)  11/27/14 163 lb (73.9 kg)  10/30/14 168 lb (76.2 kg)    Vital signs reviewed - Note on arrival 02 sats  100% on RA     HEENT: nl dentition, turbinates bilaterally, and oropharynx. Nl external ear canals without cough reflex   NECK :  without JVD/Nodes/TM/ nl carotid upstrokes bilaterally   LUNGS: no acc muscle use,  Nl contour chest which is clear to A and P bilaterally without cough on insp or exp maneuvers   CV:  RRR  no s3 or murmur or increase in P2, and no edema   ABD:  soft and nontender with nl inspiratory excursion in the supine position. No bruits or organomegaly appreciated, bowel sounds nl  MS:  Nl gait/ ext warm without deformities, calf tenderness, cyanosis or clubbing No obvious joint restrictions   SKIN: warm and dry without lesions    NEURO:  alert, approp, nl sensorium with  no motor or cerebellar deficits apparent.      CXR PA and Lateral:   08/23/2016 :    I personally reviewed images and agree with radiology impression as follows:   No active cardiopulmonary disease. Hyperinflated appearance of the lungs. The nodular density seen on prior neck CT is not apparent radiographically. Dedicated chest CT may be necessary for direct correlation.  Assessment:

## 2016-08-23 NOTE — Patient Instructions (Addendum)
Please remember to go to the x-ray department downstairs in the basement  for your tests - we will call you with the results when they are available.     We will set up for a follow up CT in 12/2016 - call sooner if any respiratry new respiratory symptoms

## 2016-08-24 DIAGNOSIS — R0609 Other forms of dyspnea: Secondary | ICD-10-CM

## 2016-08-24 NOTE — Progress Notes (Signed)
Spoke with pt and notified of results per Dr. Wert. Pt verbalized understanding and denied any questions. 

## 2016-08-24 NOTE — Assessment & Plan Note (Signed)
Spirometry 08/23/2016  FEV1 1.83 (79%)  Ratio 68 s post saba study but classic curvature    She does have mild curvature and lots of passive smoke exp so could have very mild copd or asthma (though note the absence of cough or variability to ex tol is against this).  For now no need to start any therapy and would want to do a before and after study first to establish this is a valid result and see what kind of reversibility she has but she was advised if having any new or worse sob/ cough/ wheeze to contact this office right away.

## 2016-08-24 NOTE — Assessment & Plan Note (Signed)
CT neck1/25/18 determinate left apical lung nodule. Follow-up non-contrast Chest CT recommended at 3-6 months to confirm persistence. If unchanged, and solid component remains <6 mm, annual CT is recommended until 5 years of stability has been established. - CXR  08/23/2016 not viz   She is very low but not zero risk so reasonable to hold off on full CT chest until 6 months and if any growth at all needs to be referred to T surgery for resection as this type of lesion not well detected on PET and she is clearly a surgical candidate based on her spirometry   Discussed in detail all the  indications, usual  risks and alternatives  relative to the benefits with patient who agrees to proceed with conservative f/u as outlined    Total time devoted to counseling  > 50 % of initial 60 min office visit:  review case with pt/ discussion of options/alternatives/ personally creating written customized instructions  in presence of pt  then going over those specific  Instructions directly with the pt including how to use all of the meds but in particular covering each new medication in detail and the difference between the maintenance= "automatic" meds and the prns using an action plan format for the latter (If this problem/symptom => do that organization reading Left to right).  Please see AVS from this visit for a full list of these instructions which I personally wrote for this pt and  are unique to this visit.

## 2016-11-01 ENCOUNTER — Encounter: Payer: Self-pay | Admitting: Gynecology

## 2016-11-22 ENCOUNTER — Other Ambulatory Visit: Payer: Self-pay | Admitting: Internal Medicine

## 2016-11-22 DIAGNOSIS — R911 Solitary pulmonary nodule: Secondary | ICD-10-CM

## 2016-11-23 ENCOUNTER — Telehealth: Payer: Self-pay | Admitting: Internal Medicine

## 2016-11-23 NOTE — Telephone Encounter (Signed)
Spoke with pt. Advised her that MW wanted her to have a CT in 12/2016. Nothing further was needed at this time.

## 2016-12-25 ENCOUNTER — Ambulatory Visit (INDEPENDENT_AMBULATORY_CARE_PROVIDER_SITE_OTHER)
Admission: RE | Admit: 2016-12-25 | Discharge: 2016-12-25 | Disposition: A | Discharge status: Home / Self Care | Payer: Self-pay | Source: Ambulatory Visit | Attending: Internal Medicine | Admitting: Internal Medicine

## 2016-12-25 DIAGNOSIS — R911 Solitary pulmonary nodule: Secondary | ICD-10-CM

## 2016-12-25 NOTE — Progress Notes (Signed)
Spoke with pt and notified of results per Dr. Wert. Pt verbalized understanding and denied any questions. 

## 2017-11-20 ENCOUNTER — Other Ambulatory Visit: Payer: Self-pay | Admitting: Internal Medicine

## 2017-11-20 DIAGNOSIS — R911 Solitary pulmonary nodule: Secondary | ICD-10-CM

## 2017-12-25 ENCOUNTER — Inpatient Hospital Stay: Admission: RE | Admit: 2017-12-25 | Payer: Self-pay | Source: Ambulatory Visit

## 2018-01-04 ENCOUNTER — Ambulatory Visit (INDEPENDENT_AMBULATORY_CARE_PROVIDER_SITE_OTHER)
Admission: RE | Admit: 2018-01-04 | Discharge: 2018-01-04 | Disposition: A | Discharge status: Home / Self Care | Payer: Self-pay | Source: Ambulatory Visit | Attending: Internal Medicine | Admitting: Internal Medicine

## 2018-01-04 DIAGNOSIS — R911 Solitary pulmonary nodule: Secondary | ICD-10-CM

## 2018-01-07 ENCOUNTER — Telehealth: Payer: Self-pay | Admitting: Internal Medicine

## 2018-01-07 NOTE — Telephone Encounter (Signed)
Notes recorded by Tanda Rockers, MD on 01/05/2018 at 6:01 AM EDT Call patient : Study is unchanged and most likely benign but radiology recs one more year f/u > placed in reminder file   Pt is aware of results and voiced her understanding. Nothing further is needed.

## 2018-01-07 NOTE — Progress Notes (Signed)
LMTCB

## 2018-09-18 ENCOUNTER — Other Ambulatory Visit: Payer: Self-pay | Admitting: Family Medicine

## 2018-09-18 DIAGNOSIS — R911 Solitary pulmonary nodule: Secondary | ICD-10-CM

## 2018-12-11 ENCOUNTER — Other Ambulatory Visit: Payer: Self-pay | Admitting: Internal Medicine

## 2018-12-11 DIAGNOSIS — R911 Solitary pulmonary nodule: Secondary | ICD-10-CM

## 2019-01-07 ENCOUNTER — Ambulatory Visit (INDEPENDENT_AMBULATORY_CARE_PROVIDER_SITE_OTHER)
Admission: RE | Admit: 2019-01-07 | Discharge: 2019-01-07 | Disposition: A | Discharge status: Home / Self Care | Payer: Self-pay | Source: Ambulatory Visit | Attending: Internal Medicine | Admitting: Internal Medicine

## 2019-01-07 ENCOUNTER — Other Ambulatory Visit: Payer: Self-pay

## 2019-01-07 DIAGNOSIS — R911 Solitary pulmonary nodule: Secondary | ICD-10-CM

## 2019-01-08 NOTE — Progress Notes (Signed)
ATC, NA and mailbox is full

## 2019-01-13 ENCOUNTER — Telehealth: Payer: Self-pay | Admitting: Internal Medicine

## 2019-01-13 NOTE — Telephone Encounter (Signed)
I called pt to give her the results but she didn't answer and voicemail is full.    Notes recorded by Tanda Rockers, MD on 01/08/2019 at 1:23 PM EDT  Call patient : Study is unchanged which favors it is benign and just need another one in a year to close the loop  Placed in reminder file

## 2019-01-14 ENCOUNTER — Encounter: Payer: Self-pay | Admitting: *Deleted

## 2019-01-14 NOTE — Telephone Encounter (Signed)
Attempted to call pt but unable to reach and unable to leave a VM due to mailbox being full. Will try to call back later.

## 2019-01-14 NOTE — Telephone Encounter (Signed)
Attempted to call pt but line went straight to VM. Unable to leave a message due to mailbox being full. Will try to call back later.

## 2019-01-14 NOTE — Telephone Encounter (Signed)
Pt returning call for results and can be reached @ [phone].Christy [name]

## 2019-01-14 NOTE — Progress Notes (Signed)
Letter mailed

## 2019-01-15 NOTE — Telephone Encounter (Signed)
Called and spoke with pt letting her know the results of the ct and stated that we would repeat the scan in 1 year. Pt verbalized understanding. Nothing further needed.

## 2019-09-20 ENCOUNTER — Ambulatory Visit: Payer: Self-pay | Attending: Internal Medicine

## 2019-09-20 DIAGNOSIS — Z23 Encounter for immunization: Secondary | ICD-10-CM

## 2019-09-20 NOTE — Progress Notes (Signed)
   Covid-19 Vaccination Clinic  Name:  Christy [name]    MRN: [id number] DOB: [birth year]  09/20/2019  Christy [name] was observed post Covid-19 immunization for 15 minutes without incident. She was provided with Vaccine Information Sheet and instruction to access the V-Safe system.   Christy [name] was instructed to call 911 with any severe reactions post vaccine: Marland Kitchen Difficulty breathing  . Swelling of face and throat  . A fast heartbeat  . A bad rash all over body  . Dizziness and weakness   Immunizations Administered    Name Date Dose VIS Date Route   Pfizer COVID-19 Vaccine 09/20/2019  9:03 AM 0.3 mL 05/30/2019 Intramuscular   Manufacturer: [place]   Lot: [id number]   [place]: [id number]

## 2019-10-14 ENCOUNTER — Ambulatory Visit: Payer: Self-pay | Attending: Internal Medicine

## 2019-10-14 DIAGNOSIS — Z23 Encounter for immunization: Secondary | ICD-10-CM

## 2019-10-14 NOTE — Progress Notes (Signed)
   Covid-19 Vaccination Clinic  Name:  Christy [name]    MRN: [id number] DOB: [birth year]  10/14/2019  [name] was observed post Covid-19 immunization for 15 minutes without incident. She was provided with Vaccine Information Sheet and instruction to access the V-Safe system.   [name] was instructed to call 911 with any severe reactions post vaccine: Marland Kitchen Difficulty breathing  . Swelling of face and throat  . A fast heartbeat  . A bad rash all over body  . Dizziness and weakness   Immunizations Administered    Name Date Dose VIS Date Route   Pfizer COVID-19 Vaccine 10/14/2019  9:25 AM 0.3 mL 08/13/2018 Intramuscular   Manufacturer: [place]   Lot: [id number]   [place]: [id number]

## 2019-12-24 ENCOUNTER — Other Ambulatory Visit: Payer: Self-pay | Admitting: Internal Medicine

## 2019-12-24 DIAGNOSIS — R911 Solitary pulmonary nodule: Secondary | ICD-10-CM

## 2019-12-24 DIAGNOSIS — R918 Other nonspecific abnormal finding of lung field: Secondary | ICD-10-CM

## 2019-12-30 ENCOUNTER — Other Ambulatory Visit: Payer: Self-pay

## 2020-01-13 ENCOUNTER — Ambulatory Visit (INDEPENDENT_AMBULATORY_CARE_PROVIDER_SITE_OTHER)
Admission: RE | Admit: 2020-01-13 | Discharge: 2020-01-13 | Disposition: A | Discharge status: Home / Self Care | Payer: 59 | Source: Ambulatory Visit | Attending: Internal Medicine | Admitting: Internal Medicine

## 2020-01-13 ENCOUNTER — Other Ambulatory Visit: Payer: Self-pay

## 2020-01-13 DIAGNOSIS — R911 Solitary pulmonary nodule: Secondary | ICD-10-CM | POA: Diagnosis not present

## 2020-01-13 DIAGNOSIS — R918 Other nonspecific abnormal finding of lung field: Secondary | ICD-10-CM | POA: Diagnosis not present

## 2020-01-14 ENCOUNTER — Telehealth: Payer: Self-pay | Admitting: Internal Medicine

## 2020-01-14 NOTE — Progress Notes (Signed)
Tried calling pt- no answer- LMTCB  

## 2020-01-14 NOTE — Progress Notes (Signed)
Called and spoke with patient and provided CT results per Dr. Melvyn Novas.  Study is unchanged which is great news but radiology wants one more in a year so I placed in reminder file.  Patient verbalized understanding.  Nothing further needed.

## 2020-01-14 NOTE — Telephone Encounter (Signed)
See result note.  

## 2020-01-14 NOTE — Progress Notes (Signed)
Results of CT called to patient.  Nothing further needed.  See result note.

## 2020-05-22 ENCOUNTER — Ambulatory Visit: Payer: 59 | Attending: Internal Medicine

## 2020-05-22 DIAGNOSIS — Z23 Encounter for immunization: Secondary | ICD-10-CM

## 2020-05-22 NOTE — Progress Notes (Signed)
   Covid-19 Vaccination Clinic  Name:  Christy [name]    MRN: [id number] DOB: 05-Mar-1961  05/22/2020  [name] was observed post Covid-19 immunization for 15 minutes without incident. She was provided with Vaccine Information Sheet and instruction to access the V-Safe system.   [name] was instructed to call 911 with any severe reactions post vaccine: Marland Kitchen Difficulty breathing  . Swelling of face and throat  . A fast heartbeat  . A bad rash all over body  . Dizziness and weakness   Immunizations Administered    Name Date Dose VIS Date Route   Pfizer COVID-19 Vaccine 05/22/2020 10:05 AM 0.3 mL 04/07/2020 Intramuscular   Manufacturer: [place]   Lot: [id number]   NDC: [address]-0

## 2020-09-06 ENCOUNTER — Ambulatory Visit (INDEPENDENT_AMBULATORY_CARE_PROVIDER_SITE_OTHER): Payer: 59 | Admitting: Registered Nurse

## 2020-09-06 ENCOUNTER — Other Ambulatory Visit: Payer: Self-pay

## 2020-09-06 ENCOUNTER — Encounter: Payer: Self-pay | Admitting: Registered Nurse

## 2020-09-06 VITALS — BP 123/86 | HR 90 | Temp 98.2°F | Resp 18 | Ht 61.0 in | Wt 162.6 lb

## 2020-09-06 DIAGNOSIS — N898 Other specified noninflammatory disorders of vagina: Secondary | ICD-10-CM

## 2020-09-06 DIAGNOSIS — Z1231 Encounter for screening mammogram for malignant neoplasm of breast: Secondary | ICD-10-CM | POA: Diagnosis not present

## 2020-09-06 DIAGNOSIS — Z1211 Encounter for screening for malignant neoplasm of colon: Secondary | ICD-10-CM

## 2020-09-06 LAB — POCT WET + KOH PREP
Trich by wet prep: ABSENT
Yeast by KOH: ABSENT
Yeast by wet prep: ABSENT

## 2020-09-06 MED ORDER — METRONIDAZOLE 500 MG PO TABS: 500.0000 mg | ORAL_TABLET | Freq: Two times a day (BID) | ORAL | 0 refills | Status: DC

## 2020-09-06 NOTE — Patient Instructions (Signed)
° ° ° °  If you have lab work done today you will be contacted with your lab results within the next 2 weeks.  If you have not heard from us then please contact us. The fastest way to get your results is to register for My Chart. ° ° °IF you received an x-ray today, you will receive an invoice from [facility]. Please contact [facility] at [phone] with questions or concerns regarding your invoice.  ° °IF you received labwork today, you will receive an invoice from [facility]. Please contact [facility] at [phone] with questions or concerns regarding your invoice.  ° °Our billing staff will not be able to assist you with questions regarding bills from these companies. ° °You will be contacted with the lab results as soon as they are available. The fastest way to get your results is to activate your My Chart account. Instructions are located on the last page of this paperwork. If you have not heard from us regarding the results in 2 weeks, please contact this office. °  ° ° ° °

## 2020-09-17 ENCOUNTER — Encounter: Payer: Self-pay | Admitting: Internal Medicine

## 2020-10-25 ENCOUNTER — Ambulatory Visit (AMBULATORY_SURGERY_CENTER): Payer: Self-pay | Admitting: *Deleted

## 2020-10-25 ENCOUNTER — Other Ambulatory Visit: Payer: Self-pay

## 2020-10-25 VITALS — Ht 61.0 in | Wt 162.0 lb

## 2020-10-25 DIAGNOSIS — Z1211 Encounter for screening for malignant neoplasm of colon: Secondary | ICD-10-CM

## 2020-10-25 MED ORDER — NA SULFATE-K SULFATE-MG SULF 17.5-3.13-1.6 GM/177ML PO SOLN: ORAL | 0 refills | Status: DC

## 2020-10-25 NOTE — Progress Notes (Signed)

## 2020-11-02 ENCOUNTER — Telehealth: Payer: Self-pay | Admitting: Internal Medicine

## 2020-11-02 NOTE — Telephone Encounter (Signed)
Called patient to remind her of her procedure on Monday 5-23.  She stated that the Suprep is to expensive and wanted to know if she could take something cheaper?  Please call patient

## 2020-11-02 NOTE — Telephone Encounter (Signed)
Called patient, offered Golytely and patient did not want to switch she states she will pay for the suprep. I encouraged her to try good rx.

## 2020-11-08 ENCOUNTER — Ambulatory Visit (AMBULATORY_SURGERY_CENTER): Payer: 59 | Admitting: Internal Medicine

## 2020-11-08 ENCOUNTER — Encounter: Payer: Self-pay | Admitting: Internal Medicine

## 2020-11-08 ENCOUNTER — Other Ambulatory Visit: Payer: Self-pay

## 2020-11-08 VITALS — BP 113/68 | HR 69 | Temp 97.3°F | Resp 17 | Ht 61.0 in | Wt 162.0 lb

## 2020-11-08 DIAGNOSIS — Z1211 Encounter for screening for malignant neoplasm of colon: Secondary | ICD-10-CM

## 2020-11-08 DIAGNOSIS — D120 Benign neoplasm of cecum: Secondary | ICD-10-CM

## 2020-11-08 DIAGNOSIS — D124 Benign neoplasm of descending colon: Secondary | ICD-10-CM

## 2020-11-08 MED ORDER — SODIUM CHLORIDE 0.9 % IV SOLN: 500.0000 mL | INTRAVENOUS | Status: DC

## 2020-11-08 NOTE — Op Note (Signed)
[facility] Patient Name: Christy [name] Procedure Date: 11/08/2020 3:32 PM MRN: [id number] Endoscopist: Jerene Bears , MD Age: 60 Referring MD:  Date of Birth: 10-Jan-1961 Gender: Female Account #: [id number] Procedure:                Colonoscopy Indications:              Screening for colorectal malignant neoplasm Medicines:                Monitored Anesthesia Care Procedure:                Pre-Anesthesia Assessment:                           - Prior to the procedure, a History and Physical                            was performed, and patient medications and                            allergies were reviewed. The patient's tolerance of                            previous anesthesia was also reviewed. The risks                            and benefits of the procedure and the sedation                            options and risks were discussed with the patient.                            All questions were answered, and informed consent                            was obtained. Prior Anticoagulants: The patient has                            taken no previous anticoagulant or antiplatelet                            agents. ASA Grade Assessment: II - A patient with                            mild systemic disease. After reviewing the risks                            and benefits, the patient was deemed in                            satisfactory condition to undergo the procedure.                           After obtaining informed consent, the colonoscope  was passed under direct vision. Throughout the                            procedure, the patient's blood pressure, pulse, and                            oxygen saturations were monitored continuously. The                            Olympus [id number]-[id number] (#[id number]) Colonoscope was                            introduced through the anus and advanced to the                            cecum, identified by  appendiceal orifice and                            ileocecal valve. The colonoscopy was performed                            without difficulty. The patient tolerated the                            procedure well. The quality of the bowel                            preparation was good. The ileocecal valve,                            appendiceal orifice, and rectum were photographed. Scope In: 3:46:17 PM Scope Out: 4:03:50 PM Scope Withdrawal Time: 0 hours 13 minutes 6 seconds  Total Procedure Duration: 0 hours 17 minutes 33 seconds  Findings:                 The digital rectal exam was normal.                           Two sessile polyps were found in the cecum. The                            polyps were 2 to 3 mm in size. These polyps were                            removed with a cold snare. Resection and retrieval                            were complete.                           A 5 mm polyp was found in the descending colon. The                            polyp was sessile. The polyp was removed  with a                            cold snare. Resection and retrieval were complete.                           Multiple small and large-mouthed diverticula were                            found in the sigmoid colon, descending colon and                            ascending colon.                           The retroflexed view of the distal rectum and anal                            verge was normal and showed no anal or rectal                            abnormalities. Complications:            No immediate complications. Estimated Blood Loss:     Estimated blood loss was minimal. Impression:               - Two 2 to 3 mm polyps in the cecum, removed with a                            cold snare. Resected and retrieved.                           - One 5 mm polyp in the descending colon, removed                            with a cold snare. Resected and retrieved.                           -  Diverticulosis in the sigmoid colon, in the                            descending colon and in the ascending colon.                           - The distal rectum and anal verge are normal on                            retroflexion view. Recommendation:           - Patient has a contact number available for                            emergencies. The signs and symptoms of potential  delayed complications were discussed with the                            patient. Return to normal activities tomorrow.                            Written discharge instructions were provided to the                            patient.                           - Resume previous diet.                           - Continue present medications.                           - Await pathology results.                           - Repeat colonoscopy is recommended. The                            colonoscopy date will be determined after pathology                            results from today's exam become available for                            review. Jerene Bears, MD 11/08/2020 4:05:54 PM This report has been signed electronically.

## 2020-11-08 NOTE — Progress Notes (Signed)
pt tolerated well. VSS. awake and to recovery. Report given to RN.  

## 2020-11-08 NOTE — Progress Notes (Signed)
Called to room to assist during endoscopic procedure.  Patient ID and intended procedure confirmed with present staff. Received instructions for my participation in the procedure from the performing physician.  

## 2020-11-08 NOTE — Progress Notes (Signed)
Pt's states no medical or surgical changes since previsit or office visit. 

## 2020-11-08 NOTE — Patient Instructions (Signed)
Handout provided on polyps and diverticulosis.  ? ?YOU HAD AN ENDOSCOPIC PROCEDURE TODAY AT THE [facility]:   Refer to the procedure report that was given to you for any specific questions about what was found during the examination.  If the procedure report does not answer your questions, please call your gastroenterologist to clarify.  If you requested that your care partner not be given the details of your procedure findings, then the procedure report has been included in a sealed envelope for you to review at your convenience later. ? ?YOU SHOULD EXPECT: Some feelings of bloating in the abdomen. Passage of more gas than usual.  Walking can help get rid of the air that was put into your GI tract during the procedure and reduce the bloating. If you had a lower endoscopy (such as a colonoscopy or flexible sigmoidoscopy) you may notice spotting of blood in your stool or on the toilet paper. If you underwent a bowel prep for your procedure, you may not have a normal bowel movement for a few days. ? ?Please Note:  You might notice some irritation and congestion in your nose or some drainage.  This is from the oxygen used during your procedure.  There is no need for concern and it should clear up in a day or so. ? ?SYMPTOMS TO REPORT IMMEDIATELY: ? ?Following lower endoscopy (colonoscopy or flexible sigmoidoscopy): ? Excessive amounts of blood in the stool ? Significant tenderness or worsening of abdominal pains ? Swelling of the abdomen that is new, acute ? Fever of 100?F or higher ? ?For urgent or emergent issues, a gastroenterologist can be reached at any hour by calling [phone]. ?Do not use MyChart messaging for urgent concerns.  ? ? ?DIET:  We do recommend a small meal at first, but then you may proceed to your regular diet.  Drink plenty of fluids but you should avoid alcoholic beverages for 24 hours. ? ?ACTIVITY:  You should plan to take it easy for the rest of today and you should NOT DRIVE  or use heavy machinery until tomorrow (because of the sedation medicines used during the test).   ? ?FOLLOW UP: ?Our staff will call the number listed on your records 48-72 hours following your procedure to check on you and address any questions or concerns that you may have regarding the information given to you following your procedure. If we do not reach you, we will leave a message.  We will attempt to reach you two times.  During this call, we will ask if you have developed any symptoms of COVID 19. If you develop any symptoms (ie: fever, flu-like symptoms, shortness of breath, cough etc.) before then, please call [phone].  If you test positive for Covid 19 in the 2 weeks post procedure, please call and report this information to us.   ? ?If any biopsies were taken you will be contacted by phone or by letter within the next 1-3 weeks.  Please call us at [phone] if you have not heard about the biopsies in 3 weeks.  ? ? ?SIGNATURES/CONFIDENTIALITY: ?You and/or your care partner have signed paperwork which will be entered into your electronic medical record.  These signatures attest to the fact that that the information above on your After Visit Summary has been reviewed and is understood.  Full responsibility of the confidentiality of this discharge information lies with you and/or your care-partner. ? ?

## 2020-11-10 ENCOUNTER — Telehealth: Payer: Self-pay | Admitting: *Deleted

## 2020-11-10 NOTE — Telephone Encounter (Signed)
Message left

## 2020-11-10 NOTE — Telephone Encounter (Signed)
Follow up call, without answer. VM left.

## 2020-11-17 ENCOUNTER — Encounter: Payer: Self-pay | Admitting: Internal Medicine

## 2020-11-30 ENCOUNTER — Telehealth: Payer: Self-pay

## 2020-11-30 NOTE — Telephone Encounter (Signed)
Patient is traveling to [place].   States has already spoken to assistant in regard to patient being able to take a medication for travel.  States she wanted to add on to that request if it is ok for patient to have an injection for yellow fever and a oral medication for Typhoid.

## 2020-12-06 ENCOUNTER — Telehealth: Payer: Self-pay | Admitting: Registered Nurse

## 2020-12-06 NOTE — Telephone Encounter (Signed)
Pt states that she was told for travel she needs to call our office to get Hep A. She has already been to the health dept and they told her to contact our office//ELEA

## 2020-12-31 ENCOUNTER — Other Ambulatory Visit: Payer: Self-pay | Admitting: Internal Medicine

## 2020-12-31 DIAGNOSIS — R911 Solitary pulmonary nodule: Secondary | ICD-10-CM

## 2021-01-11 ENCOUNTER — Inpatient Hospital Stay: Admission: RE | Admit: 2021-01-11 | Payer: 59 | Source: Ambulatory Visit

## 2021-04-11 NOTE — Telephone Encounter (Signed)
This concern has been previously addressed by myself and/or another provider.  If they patient has ongoing concerns, they can contact me at their convenience.  Thank you,  Rich Mehul Rudin, NP 

## 2021-04-11 NOTE — Telephone Encounter (Signed)
This concern has been previously addressed by myself and/or another provider.  If they patient has ongoing concerns, they can contact me at their convenience.  Thank you,  Rich Ayme Short, NP 

## 2021-04-24 NOTE — Progress Notes (Signed)
New Patient Office Visit  Subjective:  Patient ID: Christy [name], female    DOB: 06-Mar-1961  Age: 60 y.o. MRN: [id number]  CC:  Chief Complaint  Patient presents with   New Patient (Initial Visit)    Patient states she is here to establish care. Patient states she feels like her uterus fell and looked and now is feeling a lot of discharge that she have to wear a depend.    HPI Christy [name] presents for visit to est care.  Concern for uterine prolapse Seeing pinkish protrusion from vaginal canal and excessive discharge Somewhat painful Has to wear depends to manage dc  No acute injury or trauma No bleeding No recent infection  Due for mammogram and colonoscopy, requesting referrals.  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   TUBAL LIGATION      Family History  Problem Relation Age of Onset   Hypertension Mother    Heart disease Mother    Stroke Mother    Cancer Father    Prostate cancer Father    Hypertension Father    Lupus Daughter    Colon cancer Neg Hx    Colon polyps Neg Hx    Esophageal cancer Neg Hx    Rectal cancer Neg Hx    Stomach cancer Neg Hx     Social History   Socioeconomic History   Marital status: Married    Spouse name: Not on file   Number of children: Not on file   Years of education: Not on file   Highest education level: Not on file  Occupational History   Not on file  Tobacco Use   Smoking status: Never   Smokeless tobacco: Never  Vaping Use   Vaping Use: Never used  Substance and Sexual Activity   Alcohol use: Yes    Comment: wine and beer occ   Drug use: No   Sexual activity: Yes    Birth control/protection: Post-menopausal    Comment: intercourse age 89, sexual partners more than 5  Other Topics  Concern   Not on file  Social History Narrative   Not on file   Social Determinants of Health   Financial Resource Strain: Not on file  Food Insecurity: Not on file  Transportation Needs: Not on file  Physical Activity: Not on file  Stress: Not on file  Social Connections: Not on file  Intimate Partner Violence: Not on file    ROS Review of Systems Per hpi   Objective:   Today's Vitals: BP 123/86   Pulse 90   Temp 98.2 F (36.8 C) (Temporal)   Resp 18   Ht 5\' 1"  (1.549 m)   Wt 162 lb 9.6 oz (73.8 kg)   LMP 05/10/2011   SpO2 96%   BMI 30.72 kg/m   Physical Exam Vitals and nursing note reviewed. Exam conducted with a chaperone present.  Constitutional:      General: She is not in acute distress.    Appearance: Normal appearance. She is not ill-appearing, toxic-appearing or diaphoretic.  Cardiovascular:     Rate and Rhythm: Normal rate and regular rhythm.     Pulses: Normal pulses.     Heart sounds: Normal heart  sounds. No murmur heard.   No friction rub. No gallop.  Pulmonary:     Effort: Pulmonary effort is normal. No respiratory distress.     Breath sounds: Normal breath sounds. No stridor. No wheezing, rhonchi or rales.  Chest:     Chest wall: No tenderness.  Genitourinary:    General: Normal vulva.     Exam position: Knee-chest position.     Pubic Area: No rash or pubic lice.      Comments: Notable for uterine prolapse Skin:    General: Skin is warm and dry.     Capillary Refill: Capillary refill takes less than 2 seconds.  Neurological:     General: No focal deficit present.     Mental Status: She is alert and oriented to person, place, and time. Mental status is at baseline.  Psychiatric:        Mood and Affect: Mood normal.        Behavior: Behavior normal.        Thought Content: Thought content normal.        Judgment: Judgment normal.    Assessment & Plan:   Problem List Items Addressed This Visit   None Visit Diagnoses     Vaginal  discharge    -  Primary   Relevant Orders   POCT Wet + KOH Prep (Completed)   Screening mammogram for breast cancer       Relevant Orders   MM Digital Screening   Screen for colon cancer       Relevant Orders   Ambulatory referral to Gastroenterology       Outpatient Encounter Medications as of 09/06/2020  Medication Sig   [DISCONTINUED] metroNIDAZOLE (FLAGYL) 500 MG tablet Take 1 tablet (500 mg total) by mouth 2 (two) times daily. (Patient not taking: Reported on 10/25/2020)   No facility-administered encounter medications on file as of 09/06/2020.    Follow-up: No follow-ups on file.   PLAN Poct wet prep shows BV. Treat as above Recommend she follow up with GYN for prolapse. Refer to GI for colonoscopy. Mm screening orders sent Patient encouraged to call clinic with any questions, comments, or concerns.  Maximiano Coss, NP

## 2021-07-27 ENCOUNTER — Ambulatory Visit (INDEPENDENT_AMBULATORY_CARE_PROVIDER_SITE_OTHER): Payer: 59 | Admitting: Registered Nurse

## 2021-07-27 ENCOUNTER — Telehealth: Payer: Self-pay | Admitting: Registered Nurse

## 2021-07-27 ENCOUNTER — Encounter: Payer: Self-pay | Admitting: Registered Nurse

## 2021-07-27 VITALS — BP 129/64 | HR 83 | Temp 98.3°F | Resp 17 | Ht 61.0 in | Wt 166.8 lb

## 2021-07-27 DIAGNOSIS — R1114 Bilious vomiting: Secondary | ICD-10-CM | POA: Diagnosis not present

## 2021-07-27 LAB — CBC WITH DIFFERENTIAL/PLATELET
Basophils Absolute: 0.1 10*3/uL (ref 0.0–0.1)
Basophils Relative: 1.3 % (ref 0.0–3.0)
Eosinophils Absolute: 0.2 10*3/uL (ref 0.0–0.7)
Eosinophils Relative: 4.7 % (ref 0.0–5.0)
HCT: 36.5 % (ref 36.0–46.0)
Hemoglobin: 11.5 g/dL — ABNORMAL LOW (ref 12.0–15.0)
Lymphocytes Relative: 36.2 % (ref 12.0–46.0)
Lymphs Abs: 1.9 10*3/uL (ref 0.7–4.0)
MCHC: 31.4 g/dL (ref 30.0–36.0)
MCV: 77.3 fl — ABNORMAL LOW (ref 78.0–100.0)
Monocytes Absolute: 0.6 10*3/uL (ref 0.1–1.0)
Monocytes Relative: 11.1 % (ref 3.0–12.0)
Neutro Abs: 2.5 10*3/uL (ref 1.4–7.7)
Neutrophils Relative %: 46.7 % (ref 43.0–77.0)
Platelets: 363 10*3/uL (ref 150.0–400.0)
RBC: 4.72 Mil/uL (ref 3.87–5.11)
RDW: 14.3 % (ref 11.5–15.5)
WBC: 5.3 10*3/uL (ref 4.0–10.5)

## 2021-07-27 MED ORDER — ONDANSETRON HCL 4 MG PO TABS: 4.0000 mg | ORAL_TABLET | Freq: Three times a day (TID) | ORAL | 0 refills | Status: DC | PRN

## 2021-07-27 NOTE — Progress Notes (Signed)
Established Patient Office Visit  Subjective:  Patient ID: Christy [name], female    DOB: 11/21/1960  Age: 61 y.o. MRN: [id number]  CC:  Chief Complaint  Patient presents with   Diarrhea    Patient states she has been experiencing some diarrhea , vomiting, body aches.     HPI Christy [name] presents for nvd  Onset 2-3 days ago Body aches, nvd Vomiting has improved, but diarrhea, nausea, aches ongoing. Did note some reddish color to stool last week, but has resolved to typical brown stool.  Notes her granddaughter has been sick with similar symptoms.   Denies lightheadedness, dizziness, shob, doe, chest pain, claudication, urinary symptoms.   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   TUBAL LIGATION      Family History  Problem Relation Age of Onset   Hypertension Mother    Heart disease Mother    Stroke Mother    Cancer Father    Prostate cancer Father    Hypertension Father    Lupus Daughter    Colon cancer Neg Hx    Colon polyps Neg Hx    Esophageal cancer Neg Hx    Rectal cancer Neg Hx    Stomach cancer Neg Hx     Social History   Socioeconomic History   Marital status: Married    Spouse name: Not on file   Number of children: Not on file   Years of education: Not on file   Highest education level: Not on file  Occupational History   Not on file  Tobacco Use   Smoking status: Never   Smokeless tobacco: Never  Vaping Use   Vaping Use: Never used  Substance and Sexual Activity   Alcohol use: Yes    Comment: wine and beer occ   Drug use: No   Sexual activity: Yes    Birth control/protection: Post-menopausal    Comment: intercourse age 63, sexual partners more than 5  Other Topics Concern   Not on file  Social History Narrative    Not on file   Social Determinants of Health   Financial Resource Strain: Not on file  Food Insecurity: Not on file  Transportation Needs: Not on file  Physical Activity: Not on file  Stress: Not on file  Social Connections: Not on file  Intimate Partner Violence: Not on file    No outpatient medications prior to visit.   No facility-administered medications prior to visit.    Allergies  Allergen Reactions   Penicillins Hives    ROS Review of Systems  Constitutional: Negative.   HENT: Negative.    Eyes: Negative.   Respiratory: Negative.    Cardiovascular: Negative.   Gastrointestinal:  Positive for abdominal pain, diarrhea, nausea and vomiting.  Genitourinary: Negative.   Musculoskeletal: Negative.   Skin: Negative.   Neurological: Negative.   Psychiatric/Behavioral: Negative.    All other systems reviewed and are negative.    Objective:    Physical Exam Vitals and nursing note reviewed.  Constitutional:      General: She is not in acute distress.    Appearance: Normal appearance. She is normal weight. She is not ill-appearing, toxic-appearing or diaphoretic.  Cardiovascular:  Rate and Rhythm: Normal rate and regular rhythm.     Heart sounds: Normal heart sounds. No murmur heard.   No friction rub. No gallop.  Pulmonary:     Effort: Pulmonary effort is normal. No respiratory distress.     Breath sounds: Normal breath sounds. No stridor. No wheezing, rhonchi or rales.  Chest:     Chest wall: No tenderness.  Abdominal:     General: Abdomen is flat. Bowel sounds are normal. There is no distension.     Palpations: Abdomen is soft. There is no mass.     Tenderness: There is abdominal tenderness (LRQ, LLQ.). There is no right CVA tenderness, left CVA tenderness, guarding or rebound.     Hernia: No hernia is present.  Skin:    General: Skin is warm and dry.  Neurological:     General: No focal deficit present.     Mental Status: She is alert and oriented  to person, place, and time. Mental status is at baseline.  Psychiatric:        Mood and Affect: Mood normal.        Behavior: Behavior normal.        Thought Content: Thought content normal.        Judgment: Judgment normal.    BP 129/64    Pulse 83    Temp 98.3 F (36.8 C) (Temporal)    Resp 17    Ht 5\' 1"  (1.549 m)    Wt 166 lb 12.8 oz (75.7 kg)    LMP 05/10/2011    SpO2 99%    BMI 31.52 kg/m  Wt Readings from Last 3 Encounters:  07/27/21 166 lb 12.8 oz (75.7 kg)  11/08/20 162 lb (73.5 kg)  10/25/20 162 lb (73.5 kg)     Health Maintenance Due  Topic Date Due   MAMMOGRAM  02/20/2009   Zoster Vaccines- Shingrix (1 of 2) Never done   PAP SMEAR-Modifier  11/26/2017   COVID-19 Vaccine (4 - Booster for Pfizer series) 07/17/2020    There are no preventive care reminders to display for this patient.  Lab Results  Component Value Date   TSH 1.597 10/30/2014   Lab Results  Component Value Date   WBC 7.0 03/22/2012   HGB 11.7 (L) 03/22/2012   HCT 36.0 03/22/2012   MCV 76.8 (L) 03/22/2012   PLT 358 03/22/2012   Lab Results  Component Value Date   NA 140 10/30/2014   K 3.9 10/30/2014   CO2 26 10/30/2014   GLUCOSE 80 10/30/2014   BUN 18 10/30/2014   CREATININE 1.06 10/30/2014   BILITOT 0.2 10/30/2014   ALKPHOS 53 10/30/2014   AST 14 10/30/2014   ALT 12 10/30/2014   PROT 7.4 10/30/2014   ALBUMIN 4.0 10/30/2014   CALCIUM 9.3 10/30/2014   GFR 60.50 01/26/2011   No results found for: CHOL No results found for: HDL No results found for: LDLCALC No results found for: TRIG No results found for: CHOLHDL No results found for: HGBA1C    Assessment & Plan:   Problem List Items Addressed This Visit   None Visit Diagnoses     Bilious vomiting with nausea    -  Primary   Relevant Medications   ondansetron (ZOFRAN) 4 MG tablet   Other Relevant Orders   Comprehensive metabolic panel   CBC with Differential/Platelet       Meds ordered this encounter  Medications    ondansetron (ZOFRAN) 4 MG tablet  Sig: Take 1 tablet (4 mg total) by mouth every 8 (eight) hours as needed for nausea or vomiting.    Dispense:  20 tablet    Refill:  0    Order Specific Question:   Supervising Provider    Answer:   Carlota Raspberry, JEFFREY R [[id number]]    Follow-up: Return in about 3 months (around 10/24/2021) for within 3 mo for CPE and labs.   PLAN Zofran for nvd. Suspect viral gastroenteritis. BRAT foods and hydration discussed Check cbc and cmp given acute illness in patient who has not had these checked in some time. Return for CPE and labs at pt convenience Patient encouraged to call clinic with any questions, comments, or concerns.  Maximiano Coss, NP

## 2021-07-27 NOTE — Telephone Encounter (Signed)
Reason for Call Symptomatic / Request for [place] has been vomiting for the past two days. She has urinated in the past 8 hours and there is no blood in her vomit. [place] in [place] No Nurse Assessment Nurse: Doren Custard, RN, Caryl Pina Date/Time (Eastern Time): 07/26/2021 5:01:55 PM Confirm and document reason for call. If symptomatic, describe symptoms. ---Caller states she has been vomiting and having diarrhea since Sunday night. She has not vomited today. She has had diarrhea x3 today. She had a fever yesterday, but it is resolved now. Does the patient have any new or worsening symptoms? ---Yes Will a triage be completed? ---Yes Related visit to physician within the last 2 weeks? ---No Does the PT have any chronic conditions? (i.e. diabetes, asthma, this includes High risk factors for pregnancy, etc.) ---No Is this a behavioral health or substance abuse call? ---No Guidelines Guideline Title Affirmed Question Affirmed Notes Nurse Date/Time (Eastern Time) Diarrhea [1] SEVERE abdominal pain (e.g., excruciating) AND [2] present > 1 hour Dixon, RN, Caryl Pina 07/26/2021 5:04:17 PM PLEASE NOTE: All timestamps contained within this report are represented as [place] Standard Time. CONFIDENTIALTY NOTICE: This fax transmission is intended only for the addressee. It contains information that is legally privileged, confidential or otherwise protected from use or disclosure. If you are not the intended recipient, you are strictly prohibited from reviewing, disclosing, copying using or disseminating any of this information or taking any action in reliance on or regarding this information. If you have received this fax in error, please notify us immediately by telephone so that we can arrange for its return to [place]. Phone: [phone], Toll-Free: [phone], Fax: [phone] Page: 2 of 2 Call Id: [id number] [place]. Time  Eilene Ghazi Time) Disposition Final User 07/26/2021 5:08:43 PM Go to ED Now Yes Doren Custard, RN, Hulan Saas Disagree/Comply Disagree Caller Understands Yes PreDisposition Call Doctor Care Advice Given Per Guideline GO TO ED NOW: * You need to be seen in the Emergency Department. CARE ADVICE given per Diarrhea (Adult) guideline. Comments User: Dorcas Carrow, RN Date/Time Eilene Ghazi Time): 07/26/2021 5:04:02 PM Three of her children have Lupus and she is wanting to get tested for that. User: Dorcas Carrow, RN Date/Time Eilene Ghazi Time): 07/26/2021 5:06:05 PM Pain is 10 out of 10. User: Dorcas Carrow, RN Date/Time Eilene Ghazi Time): 07/26/2021 5:08:40 PM Caller wants to go to [facility] instead of ED. Referrals GO TO FACILITY OTHER - SPECIFY

## 2021-07-27 NOTE — Telephone Encounter (Signed)
Patient has an appt today with richard

## 2021-07-27 NOTE — Patient Instructions (Addendum)
Ms. Creech -   Doristine Devoid to see you. Let's get you feeling better.  Call if abdominal pain worsens or fails to improve.  Come back for a physical and labs at your convenience  Thank you  Denice Paradise     If you have lab work done today you will be contacted with your lab results within the next 2 weeks.  If you have not heard from [place] then please contact us. The fastest way to get your results is to register for My Chart.   IF you received an x-ray today, you will receive an invoice from [facility]. Please contact [facility] at [phone] with questions or concerns regarding your invoice.   IF you received labwork today, you will receive an invoice from [facility]. Please contact [facility] at [phone] with questions or concerns regarding your invoice.   Our billing staff will not be able to assist you with questions regarding bills from these companies.  You will be contacted with the lab results as soon as they are available. The fastest way to get your results is to activate your My Chart account. Instructions are located on the last page of this paperwork. If you have not heard from [place] regarding the results in 2 weeks, please contact this office.

## 2021-07-28 LAB — COMPREHENSIVE METABOLIC PANEL
ALT: 12 U/L (ref 0–35)
AST: 14 U/L (ref 0–37)
Albumin: 3.6 g/dL (ref 3.5–5.2)
Alkaline Phosphatase: 45 U/L (ref 39–117)
BUN: 10 mg/dL (ref 6–23)
CO2: 33 mEq/L — ABNORMAL HIGH (ref 19–32)
Calcium: 9.1 mg/dL (ref 8.4–10.5)
Chloride: 104 mEq/L (ref 96–112)
Creatinine, Ser: 0.95 mg/dL (ref 0.40–1.20)
GFR: 64.25 mL/min (ref >60.00)
Glucose, Bld: 75 mg/dL (ref 70–99)
Potassium: 3.7 mEq/L (ref 3.5–5.1)
Sodium: 142 mEq/L (ref 135–145)
Total Bilirubin: 0.2 mg/dL (ref 0.2–1.2)
Total Protein: 6.9 g/dL (ref 6.0–8.3)

## 2021-10-25 ENCOUNTER — Other Ambulatory Visit: Payer: Self-pay

## 2021-10-25 ENCOUNTER — Encounter: Payer: Self-pay | Admitting: Registered Nurse

## 2021-10-25 ENCOUNTER — Ambulatory Visit (INDEPENDENT_AMBULATORY_CARE_PROVIDER_SITE_OTHER): Payer: Commercial Managed Care - PPO | Admitting: Registered Nurse

## 2021-10-25 VITALS — BP 123/69 | HR 86 | Temp 98.0°F | Resp 18 | Ht 61.0 in | Wt 170.0 lb

## 2021-10-25 DIAGNOSIS — R5382 Chronic fatigue, unspecified: Secondary | ICD-10-CM

## 2021-10-25 DIAGNOSIS — R21 Rash and other nonspecific skin eruption: Secondary | ICD-10-CM | POA: Diagnosis not present

## 2021-10-25 DIAGNOSIS — E66811 Obesity, class 1: Secondary | ICD-10-CM

## 2021-10-25 DIAGNOSIS — E669 Obesity, unspecified: Secondary | ICD-10-CM | POA: Diagnosis not present

## 2021-10-25 DIAGNOSIS — Z0000 Encounter for general adult medical examination without abnormal findings: Secondary | ICD-10-CM | POA: Diagnosis not present

## 2021-10-25 MED ORDER — PHENTERMINE HCL 37.5 MG PO CAPS: 37.5000 mg | ORAL_CAPSULE | ORAL | 0 refills | Status: DC

## 2021-10-25 NOTE — Progress Notes (Signed)
? ?Complete physical exam ? ?Patient: Christy [name]   DOB: [birth year]   61 y.o. Female  MRN: [id number] ?Visit Date: 10/25/2021 ? ?Subjective:  ?  ?Chief Complaint  ?Patient presents with  ? Annual Exam  ?  Patient states she is here for a CPE. Patient states she has some concerns about weight   ? ? ?Christy [name] is a 61 y.o. female who presents today for a complete physical exam. She reports consuming a general diet. Home exercise routine includes  . She generally feels well. She reports sleeping well. She does not have additional problems to discuss today.  ? ?Vision:Within the last year ?Dental:Within Last 6 months ?STD Screen:No ? ?Obesity BMI 32 ?Interested in options ?Notes poor diet choices with passing of her brother in past few months. ?She is concerned about mental health medications. ? ?Lupus ?She has a daughter and two granddaughters with SLE ?Would like to check ANA as she has had chronic fatigue ? ?Most recent fall risk assessment: ? ?  10/25/2021  ?  2:18 PM  ?Fall Risk   ?Falls in the past year? 0  ?Number falls in past yr: 0  ?Injury with Fall? 0  ?Risk for fall due to : No Fall Risks  ?Follow up Falls evaluation completed  ? ?  ?Most recent depression screenings: ? ?  10/25/2021  ?  2:18 PM 09/06/2020  ?  2:40 PM  ?PHQ 2/9 Scores  ?PHQ - 2 Score 0 0  ?PHQ- 9 Score 0   ? ? ? ?Patient Active Problem List  ? Diagnosis Date Noted  ? Dyspnea on exertion 08/24/2016  ? Solitary pulmonary nodule 08/23/2016  ? Acute bronchitis 05/23/2011  ? Migraine headache 01/26/2011  ? Fatigue 01/26/2011  ? ?Past Medical History:  ?Diagnosis Date  ? Allergy   ? Migraine aura without headache 1998  ? ?Past Surgical History:  ?Procedure Laterality Date  ? COLONOSCOPY  15 years ago   ? in [place] normal exam per pt  ? 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 TUBAL LIGATION    ? ?Social History  ? ?Tobacco Use  ? Smoking status: Never  ? Smokeless tobacco: Never  ?Vaping Use  ? Vaping Use: Never used  ?Substance Use  Topics  ? Alcohol use: Yes  ?  Comment: wine and beer occ  ? Drug use: No  ? ?Social History  ? ?Socioeconomic History  ? Marital status: Married  ?  Spouse name: Not on file  ? Number of children: Not on file  ? Years of education: Not on file  ? Highest education level: Not on file  ?Occupational History  ? Not on file  ?Tobacco Use  ? Smoking status: Never  ? Smokeless tobacco: Never  ?Vaping Use  ? Vaping Use: Never used  ?Substance and Sexual Activity  ? Alcohol use: Yes  ?  Comment: wine and beer occ  ? Drug use: No  ? Sexual activity: Yes  ?  Birth control/protection: Post-menopausal  ?  Comment: intercourse age 34, sexual partners more than 5  ?Other Topics Concern  ? Not on file  ?Social History Narrative  ? Not on file  ? ?Social Determinants of Health  ? ?Financial Resource Strain: Not on file  ?Food Insecurity: Not on file  ?Transportation Needs: Not on file  ?Physical Activity: Not on file  ?Stress: Not on file  ?Social Connections: Not on file  ?Intimate Partner Violence: Not on  file  ? ?Family Status  ?Relation Name Status  ? Mother  Alive  ? Father  Deceased  ? Daughter  (Not Specified)  ? Neg Hx  (Not Specified)  ? ?Family History  ?Problem Relation Age of Onset  ? Hypertension Mother   ? Heart disease Mother   ? Stroke Mother   ? Cancer Father   ? Prostate cancer Father   ? Hypertension Father   ? Lupus Daughter   ? Colon cancer Neg Hx   ? Colon polyps Neg Hx   ? Esophageal cancer Neg Hx   ? Rectal cancer Neg Hx   ? Stomach cancer Neg Hx   ? ?Allergies  ?Allergen Reactions  ? Penicillins Hives  ? ?  ?Patient Care Team: ?Maximiano Coss, NP as PCP - General (Adult Health Nurse Practitioner)  ? ?Medications: ?Outpatient Medications Prior to Visit  ?Medication Sig  ? [DISCONTINUED] ondansetron (ZOFRAN) 4 MG tablet Take 1 tablet (4 mg total) by mouth every 8 (eight) hours as needed for nausea or vomiting. (Patient not taking: Reported on 10/25/2021)  ? ?No facility-administered medications prior to  visit.  ? ? ?Review of Systems  ?Constitutional: Negative.   ?HENT: Negative.    ?Eyes: Negative.   ?Respiratory: Negative.    ?Cardiovascular: Negative.   ?Gastrointestinal: Negative.   ?Genitourinary: Negative.   ?Musculoskeletal: Negative.   ?Skin: Negative.   ?Neurological: Negative.   ?Psychiatric/Behavioral: Negative.    ?All other systems reviewed and are negative. ? ?Last CBC ?Lab Results  ?Component Value Date  ? WBC 5.3 07/27/2021  ? HGB 11.5 (L) 07/27/2021  ? HCT 36.5 07/27/2021  ? MCV 77.3 (L) 07/27/2021  ? MCH 24.9 (L) 03/22/2012  ? RDW 14.3 07/27/2021  ? PLT 363.0 07/27/2021  ? ?Last metabolic panel ?Lab Results  ?Component Value Date  ? GLUCOSE 75 07/27/2021  ? NA 142 07/27/2021  ? K 3.7 07/27/2021  ? CL 104 07/27/2021  ? CO2 33 (H) 07/27/2021  ? BUN 10 07/27/2021  ? CREATININE 0.95 07/27/2021  ? GFRNONAA 53 (L) 03/22/2012  ? CALCIUM 9.1 07/27/2021  ? PROT 6.9 07/27/2021  ? ALBUMIN 3.6 07/27/2021  ? BILITOT 0.2 07/27/2021  ? ALKPHOS 45 07/27/2021  ? AST 14 07/27/2021  ? ALT 12 07/27/2021  ? ?Last lipids ?No results found for: CHOL, HDL, LDLCALC, LDLDIRECT, TRIG, CHOLHDL ?Last hemoglobin A1c ?No results found for: HGBA1C ?Last thyroid functions ?Lab Results  ?Component Value Date  ? TSH 1.597 10/30/2014  ? ?Last vitamin D ?No results found for: 25OHVITD2, [place], VD25OH ?Last vitamin B12 and Folate ?No results found for: VITAMINB12, FOLATE ?  ?   ?Objective:  ? ?  ?BP 123/69   Pulse 86   Temp 98 ?F (36.7 ?C) (Temporal)   Resp 18   Ht '5\' 1"'$  (1.549 m)   Wt 170 lb (77.1 kg)   LMP 05/10/2011   SpO2 97%   BMI 32.12 kg/m?  ? ?BP Readings from Last 3 Encounters:  ?10/25/21 123/69  ?07/27/21 129/64  ?11/08/20 113/68  ? ?Wt Readings from Last 3 Encounters:  ?10/25/21 170 lb (77.1 kg)  ?07/27/21 166 lb 12.8 oz (75.7 kg)  ?11/08/20 162 lb (73.5 kg)  ? ?SpO2 Readings from Last 3 Encounters:  ?10/25/21 97%  ?07/27/21 99%  ?11/08/20 100%  ? ?  ? ?Physical Exam ?Vitals and nursing note reviewed.   ?Constitutional:   ?   General: She is not in acute distress. ?   Appearance: Normal appearance. She  is obese. She is not ill-appearing, toxic-appearing or diaphoretic.  ?HENT:  ?   Head: Normocephalic and atraumatic.  ?   Right Ear: Tympanic membrane, ear canal and external ear normal. There is no impacted cerumen.  ?   Left Ear: Tympanic membrane, ear canal and external ear normal. There is no impacted cerumen.  ?   Nose: Nose normal. No congestion or rhinorrhea.  ?   Mouth/Throat:  ?   Mouth: Mucous membranes are moist.  ?   Pharynx: Oropharynx is clear. No oropharyngeal exudate or posterior oropharyngeal erythema.  ?Eyes:  ?   General: No scleral icterus.    ?   Right eye: No discharge.     ?   Left eye: No discharge.  ?   Extraocular Movements: Extraocular movements intact.  ?   Conjunctiva/sclera: Conjunctivae normal.  ?   Pupils: Pupils are equal, round, and reactive to light.  ?Neck:  ?   Vascular: No carotid bruit.  ?Cardiovascular:  ?   Rate and Rhythm: Normal rate and regular rhythm.  ?   Pulses: Normal pulses.  ?   Heart sounds: Normal heart sounds. No murmur heard. ?  No friction rub. No gallop.  ?Pulmonary:  ?   Effort: Pulmonary effort is normal. No respiratory distress.  ?   Breath sounds: Normal breath sounds. No stridor. No wheezing, rhonchi or rales.  ?Chest:  ?   Chest wall: No tenderness.  ?Abdominal:  ?   General: Abdomen is flat. Bowel sounds are normal. There is no distension.  ?   Palpations: There is no mass.  ?   Tenderness: There is no abdominal tenderness. There is no right CVA tenderness, left CVA tenderness, guarding or rebound.  ?   Hernia: No hernia is present.  ?Musculoskeletal:     ?   General: No swelling, tenderness, deformity or signs of injury. Normal range of motion.  ?   Cervical back: Normal range of motion and neck supple. No rigidity or tenderness.  ?   Right lower leg: No edema.  ?   Left lower leg: No edema.  ?Lymphadenopathy:  ?   Cervical: No cervical adenopathy.   ?Skin: ?   General: Skin is warm and dry.  ?   Capillary Refill: Capillary refill takes less than 2 seconds.  ?   Coloration: Skin is not jaundiced or pale.  ?   Findings: No bruising, erythema or rash.  ?Neurological:  ?   Ge

## 2021-10-25 NOTE — Patient Instructions (Addendum)
Christy [name] -  ? ?Great  ? ? ? ?If you have lab work done today you will be contacted with your lab results within the next 2 weeks.  If you have not heard from [place] then please contact us. The fastest way to get your results is to register for My Chart. ? ? ?IF you received an x-ray today, you will receive an invoice from [facility]. Please contact [facility] at [phone] with questions or concerns regarding your invoice.  ? ?IF you received labwork today, you will receive an invoice from [facility]. Please contact [facility] at [phone] with questions or concerns regarding your invoice.  ? ?Our billing staff will not be able to assist you with questions regarding bills from these companies. ? ?You will be contacted with the lab results as soon as they are available. The fastest way to get your results is to activate your My Chart account. Instructions are located on the last page of this paperwork. If you have not heard from [place] regarding the results in 2 weeks, please contact this office. ?  ? ? ?

## 2021-10-26 LAB — CBC WITH DIFFERENTIAL/PLATELET
Basophils Absolute: 0.1 10*3/uL (ref 0.0–0.1)
Basophils Relative: 1 % (ref 0.0–3.0)
Eosinophils Absolute: 0.4 10*3/uL (ref 0.0–0.7)
Eosinophils Relative: 5.3 % — ABNORMAL HIGH (ref 0.0–5.0)
HCT: 36.9 % (ref 36.0–46.0)
Hemoglobin: 12 g/dL (ref 12.0–15.0)
Lymphocytes Relative: 39.4 % (ref 12.0–46.0)
Lymphs Abs: 2.7 10*3/uL (ref 0.7–4.0)
MCHC: 32.5 g/dL (ref 30.0–36.0)
MCV: 77.1 fl — ABNORMAL LOW (ref 78.0–100.0)
Monocytes Absolute: 0.4 10*3/uL (ref 0.1–1.0)
Monocytes Relative: 6.2 % (ref 3.0–12.0)
Neutro Abs: 3.3 10*3/uL (ref 1.4–7.7)
Neutrophils Relative %: 48.1 % (ref 43.0–77.0)
Platelets: 401 10*3/uL — ABNORMAL HIGH (ref 150.0–400.0)
RBC: 4.79 Mil/uL (ref 3.87–5.11)
RDW: 14.9 % (ref 11.5–15.5)
WBC: 6.9 10*3/uL (ref 4.0–10.5)

## 2021-10-26 LAB — COMPREHENSIVE METABOLIC PANEL
ALT: 14 U/L (ref 0–35)
AST: 16 U/L (ref 0–37)
Albumin: 4.3 g/dL (ref 3.5–5.2)
Alkaline Phosphatase: 55 U/L (ref 39–117)
BUN: 16 mg/dL (ref 6–23)
CO2: 27 mEq/L (ref 19–32)
Calcium: 9.2 mg/dL (ref 8.4–10.5)
Chloride: 103 mEq/L (ref 96–112)
Creatinine, Ser: 1.01 mg/dL (ref 0.40–1.20)
GFR: 59.59 mL/min — ABNORMAL LOW (ref >60.00)
Glucose, Bld: 73 mg/dL (ref 70–99)
Potassium: 4.2 mEq/L (ref 3.5–5.1)
Sodium: 139 mEq/L (ref 135–145)
Total Bilirubin: 0.3 mg/dL (ref 0.2–1.2)
Total Protein: 7.7 g/dL (ref 6.0–8.3)

## 2021-10-26 LAB — T4, FREE: Free T4: 0.72 ng/dL (ref 0.60–1.60)

## 2021-10-26 LAB — TSH: TSH: 1.6 u[IU]/mL (ref 0.35–5.50)

## 2021-10-27 LAB — IRON,TIBC AND FERRITIN PANEL
%SAT: 24 % (calc) (ref 16–45)
Ferritin: 69 ng/mL (ref 16–288)
Iron: 79 ug/dL (ref 45–160)
TIBC: 332 mcg/dL (calc) (ref 250–450)

## 2021-10-27 LAB — ANA: Anti Nuclear Antibody (ANA): NEGATIVE

## 2022-05-01 ENCOUNTER — Ambulatory Visit: Payer: 59 | Admitting: Registered Nurse

## 2022-07-03 IMAGING — CT CT CHEST W/O CM
2 of 4 series · 15 of 36 positions shown, 18 images · non-contrast
Comparison: 01/07/2019

CLINICAL DATA: Abnormal x-ray with pulmonary nodule follow-up

EXAM:
CT CHEST WITHOUT CONTRAST
TECHNIQUE: Multidetector CT imaging of the chest was performed following the
standard protocol without IV contrast.

[Series 2: thorax · axial · 0.63mm/px · z∈[-282,-32]mm · 12 of 149 slices shown, 15 images]
[im 12/149  mediastinal]
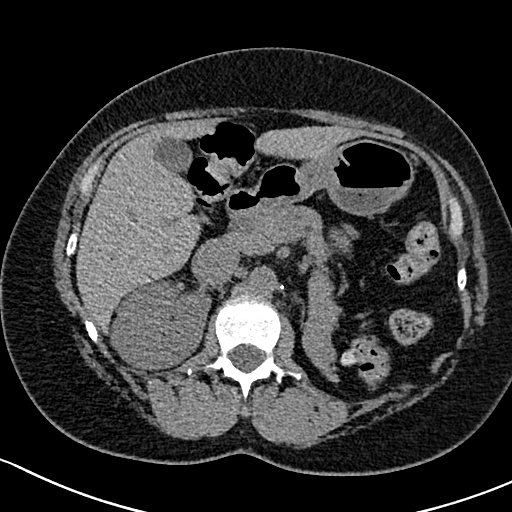
[im 12/149  lung]
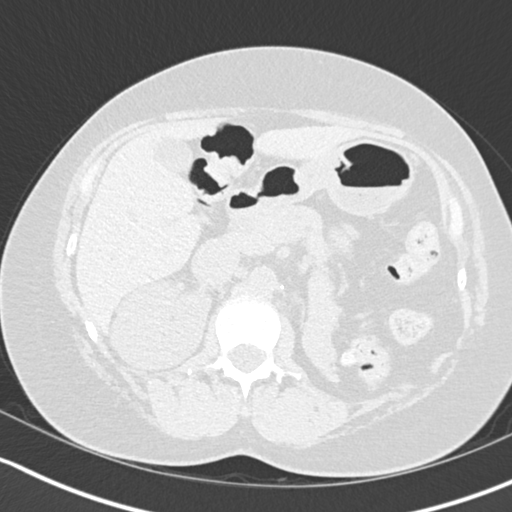
[im 23/149  lung]
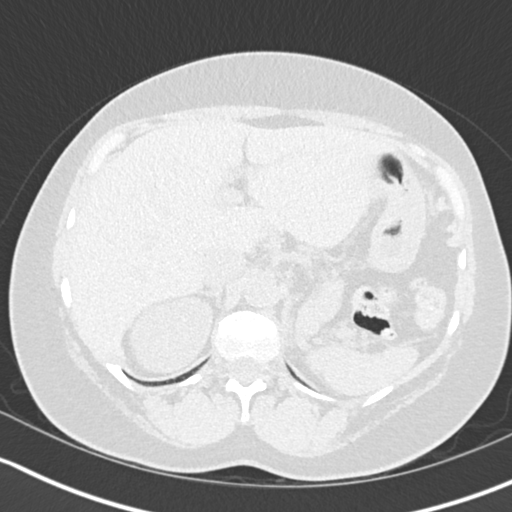
[im 35/149  lung]
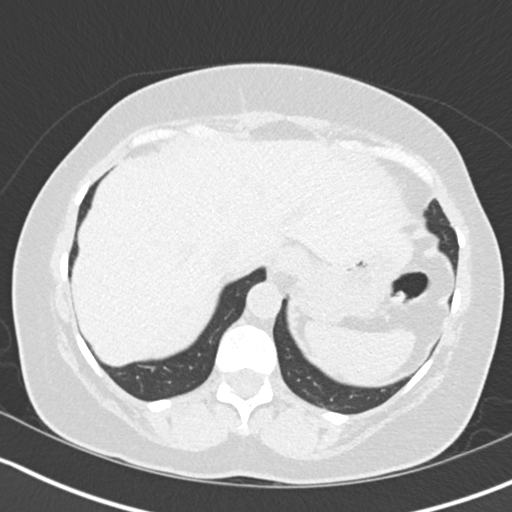
[im 46/149  lung]
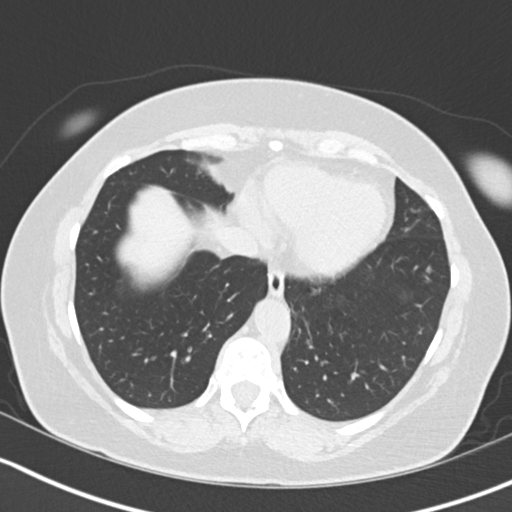
[im 57/149  mediastinal]
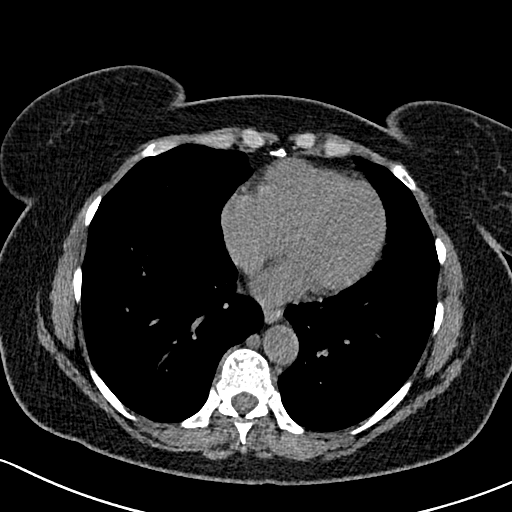
[im 57/149  lung]
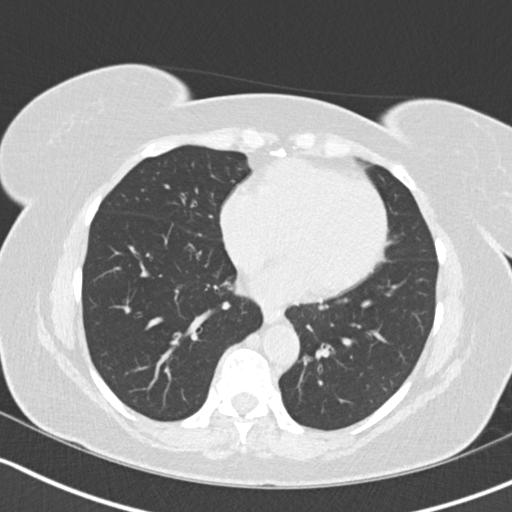
[im 69/149  lung]
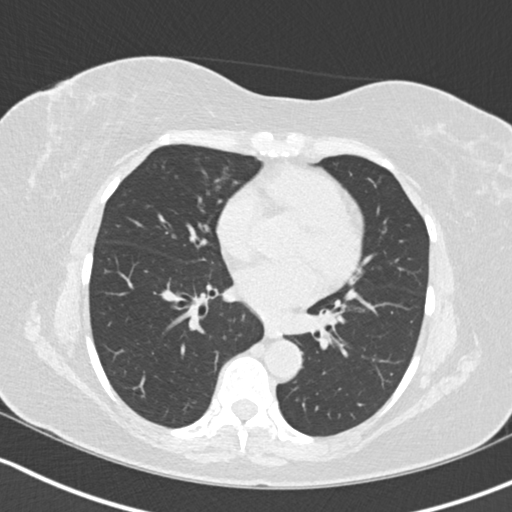
[im 80/149  lung]
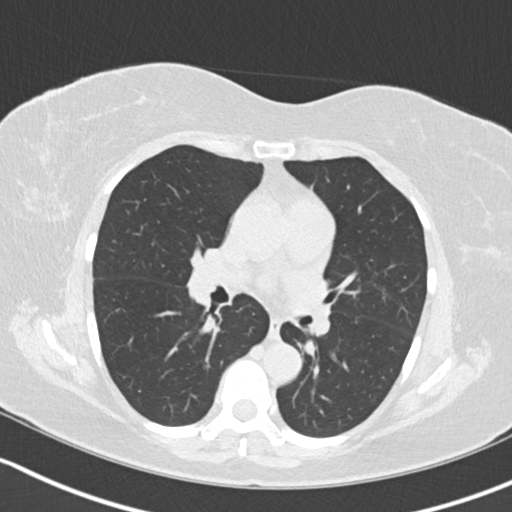
[im 92/149  lung]
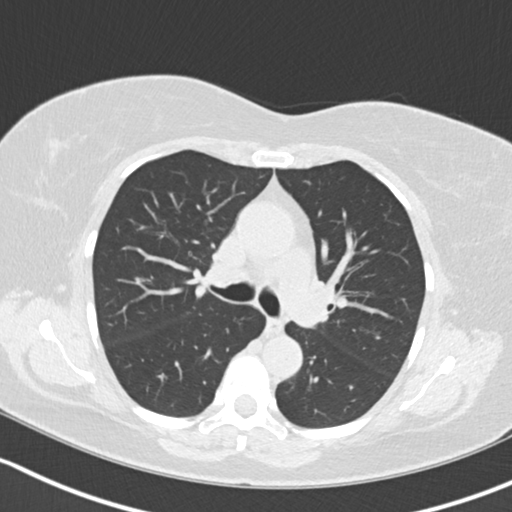
[im 103/149  mediastinal]
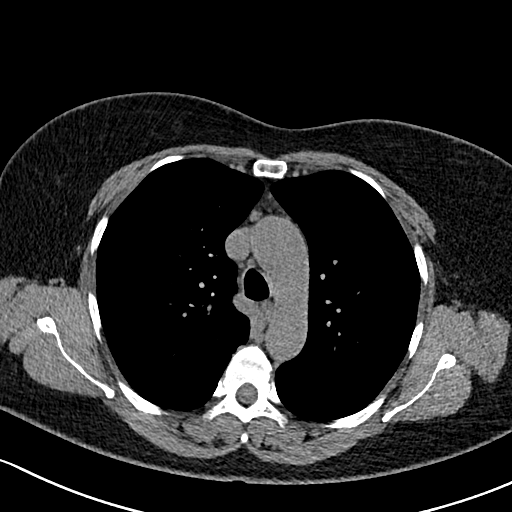
[im 103/149  lung]
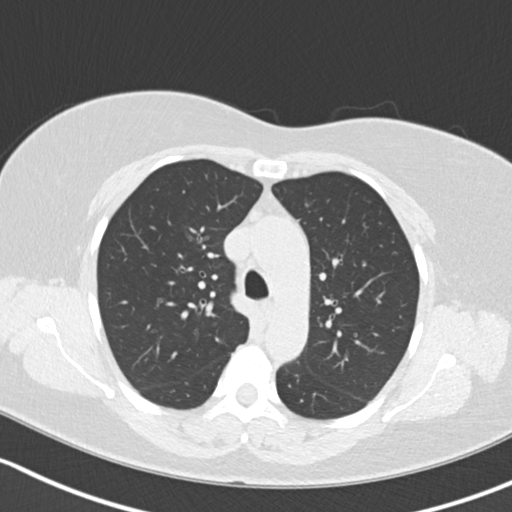
[im 114/149  lung]
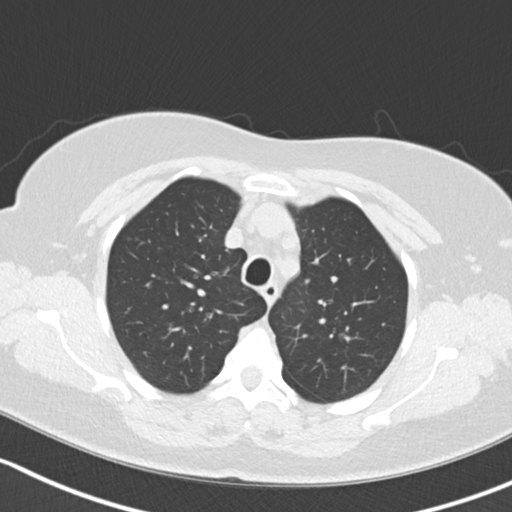
[im 126/149  lung]
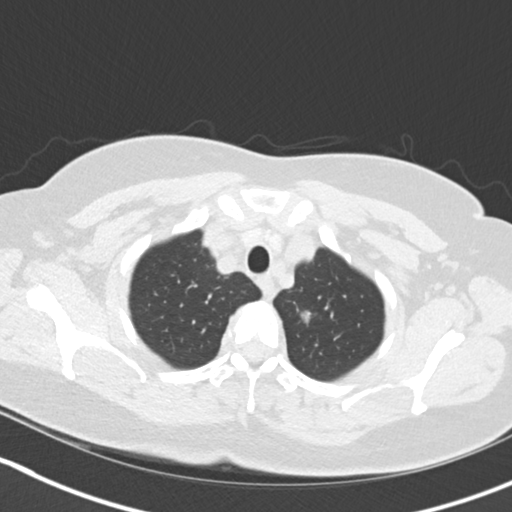
[im 137/149  lung]
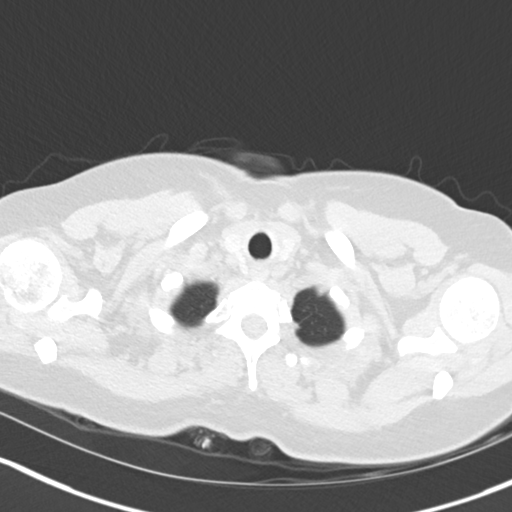

[Series 5: coronal · coronal · 0.59mm/px · 3 of 110 slices shown]
[im 22/110  lung]
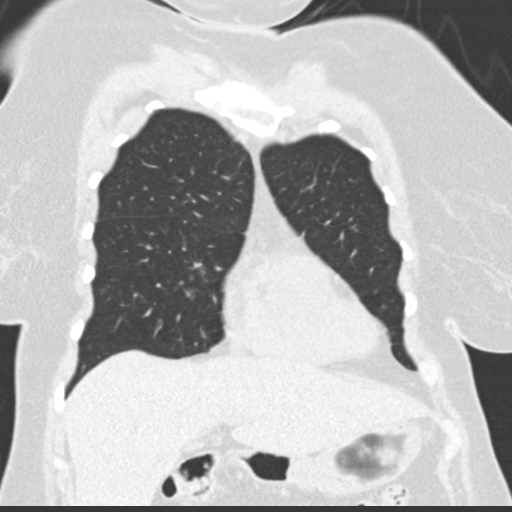
[im 44/110  lung]
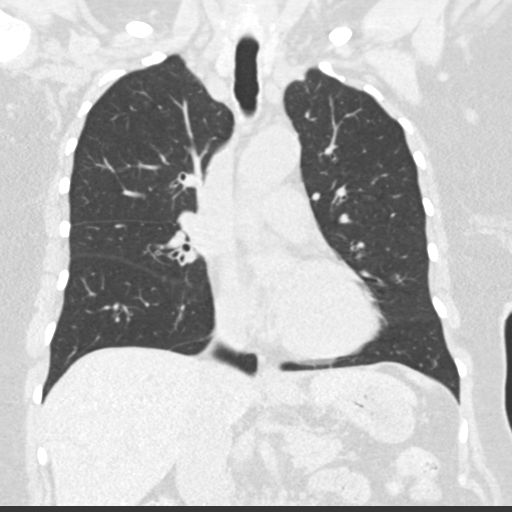
[im 66/110  lung]
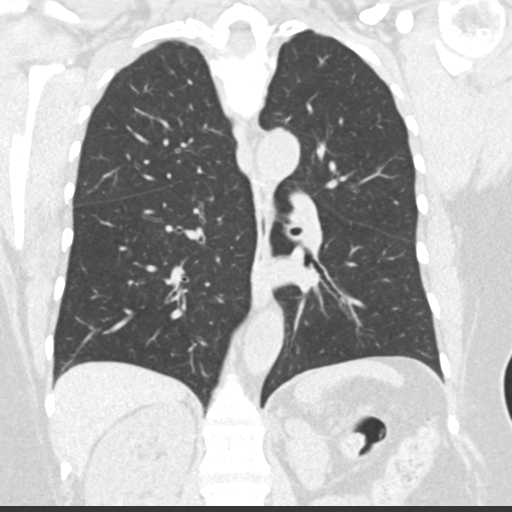

[15 of 36 positions shown; findings below may reference images not displayed]

FINDINGS: Cardiovascular: Non-contrast appearance of heart and great vessels
is unremarkable. No pericardial effusion. No change from prior
imaging.

Mediastinum/Nodes: No thoracic inlet adenopathy. No axillary
lymphadenopathy. No mediastinal lymphadenopathy. No hilar
lymphadenopathy.

Lungs/Pleura: Small nodule LEFT lung apex, subsolid measuring
approximately 9 x 7 mm. Finding is unchanged over the 1 year
interval. And when measured in a similar fashion and Sunday June, 2016
is within 1 mm.

Mild bronchial wall thickening bilaterally. No new nodule. No
consolidation. No pleural effusion.

Upper Abdomen: Incidental imaging of upper abdominal contents with
changes of LEFT nephrectomy related kidney donation by report. No
acute process in the upper abdomen.

Musculoskeletal: No acute bone finding. No destructive bone process.
IMPRESSION: 1. Small spiculated nodule in the LEFT lung apex, subsolid measuring
approximately 9 x 7 mm. Suggest continued follow-up to reach 5 year
stability, next follow-up in 1 year. Follow-up non-contrast CT
recommended at 3-6 months to confirm persistence. If unchanged, and
solid component remains <6 mm, annual CT is recommended until 5
years of stability has been established. If persistent these nodules
should be considered highly suspicious if the solid component of the
nodule is 6 mm or greater in size and enlarging. This recommendation
follows the consensus statement: Guidelines for Management of
Incidental Pulmonary Nodules Detected on CT Images: From the
2. Mild bronchial wall thickening bilaterally. Correlate with any
clinical signs of bronchitis.
3. No new nodule.
4. Changes of LEFT nephrectomy related kidney donation by report.

## 2022-08-07 NOTE — Progress Notes (Unsigned)
Subjective:     Patient ID: Christy [name], female   DOB: [birth year],   MRN: [id number]  HPI   70  yobf from [place] never smoker Lots of passive smoke exp from husband but no other risk factors referred to pulmonary clinic 08/23/2016 by Dr   Jacelyn Grip for [facility]    08/23/2016 1st [place] [facility] office visit/ Christy [name]   Chief Complaint  Patient presents with   Pulmonary Consult    Referred by Dr. Yaakov Guthrie for eval of pulmonary nodule. She does c/o some SOB over the past year. She gets winded walking up stairs.   doe = MMRC1 = can walk nl pace, flat grade, can't hurry or go uphills or steps s sob, indolent onset, not really progressive Rec We will set up for a follow up CT in 12/2016 - call sooner if any respiratry new respiratory symptoms    08/08/2022  f/u ov/Dennie Moltz re: ***   maint on ***  No chief complaint on file.   Dyspnea:  *** Cough: *** Sleeping: *** SABA use: *** 02: *** Covid status:   *** Lung cancer screening :  ***    No obvious day to day or daytime variability or assoc excess/ purulent sputum or mucus plugs or hemoptysis or cp or chest tightness, subjective wheeze or overt sinus or hb symptoms.   *** without nocturnal  or early am exacerbation  of respiratory  c/o's or need for noct saba. Also denies any obvious fluctuation of symptoms with weather or environmental changes or other aggravating or alleviating factors except as outlined above   No unusual exposure hx or h/o childhood pna/ asthma or knowledge of premature birth.  Current Allergies, Complete Past Medical History, Past Surgical History, Family History, and Social History were reviewed in [employer] record.  ROS  The following are not active complaints unless bolded Hoarseness, sore throat, dysphagia, dental problems, itching, sneezing,  nasal congestion or discharge of excess mucus or purulent secretions, ear ache,   fever, chills, sweats, unintended wt loss or wt gain, classically  pleuritic or exertional cp,  orthopnea pnd or arm/hand swelling  or leg swelling, presyncope, palpitations, abdominal pain, anorexia, nausea, vomiting, diarrhea  or change in bowel habits or change in bladder habits, change in stools or change in urine, dysuria, hematuria,  rash, arthralgias, visual complaints, headache, numbness, weakness or ataxia or problems with walking or coordination,  change in mood or  memory.        No outpatient medications have been marked as taking for the 08/08/22 encounter (Appointment) with Tanda Rockers, MD.             Objective:   Physical Exam  08/08/2022        ***   08/23/16 165 lb 9.6 oz (75.1 kg)  11/27/14 163 lb (73.9 kg)  10/30/14 168 lb (76.2 kg)    Vital signs reviewed  08/08/2022  - Note at rest 02 sats  ***% on ***   General appearance:    ***              Assessment:

## 2022-08-08 ENCOUNTER — Encounter: Payer: Self-pay | Admitting: Internal Medicine

## 2022-08-08 ENCOUNTER — Ambulatory Visit (INDEPENDENT_AMBULATORY_CARE_PROVIDER_SITE_OTHER): Payer: Commercial Managed Care - PPO | Admitting: Internal Medicine

## 2022-08-08 VITALS — BP 128/76 | HR 81 | Temp 98.3°F | Ht 61.0 in | Wt 170.0 lb

## 2022-08-08 DIAGNOSIS — R911 Solitary pulmonary nodule: Secondary | ICD-10-CM

## 2022-08-08 DIAGNOSIS — R058 Other specified cough: Secondary | ICD-10-CM | POA: Diagnosis not present

## 2022-08-08 DIAGNOSIS — R0609 Other forms of dyspnea: Secondary | ICD-10-CM | POA: Diagnosis not present

## 2022-08-08 MED ORDER — METHYLPREDNISOLONE ACETATE 80 MG/ML IJ SUSP
120.0000 mg | Freq: Once | INTRAMUSCULAR | Status: AC
  Administered 2022-08-08: 120 mg via INTRAMUSCULAR

## 2022-08-08 MED ORDER — PANTOPRAZOLE SODIUM 40 MG PO TBEC: 40.0000 mg | DELAYED_RELEASE_TABLET | Freq: Every day | ORAL | 2 refills | Status: DC

## 2022-08-08 MED ORDER — FAMOTIDINE 20 MG PO TABS
ORAL_TABLET | ORAL | 11 refills | Status: AC
Start: 2022-08-08 — End: ?

## 2022-08-08 NOTE — Patient Instructions (Addendum)
We need a copy of the labs drawn at your last visit on Jul 19 2022   CT with contrast  after we check your kidney function  Depomedrol 120 mg IM today    Pantoprazole (protonix) 40 mg   Take  30-60 min before first meal of the day and Pepcid (famotidine)  20 mg after supper until return to office - this is the best way to tell whether stomach acid is contributing to your problem.    GERD (REFLUX)  is an extremely common cause of respiratory symptoms just like yours , many times with no obvious heartburn at all.    It can be treated with medication, but also with lifestyle changes including elevation of the head of your bed (ideally with 6 -8inch blocks under the headboard of your bed),  Smoking cessation, avoidance of late meals, excessive alcohol, and avoid fatty foods, chocolate, peppermint, colas, red wine, and acidic juices such as orange juice.  NO MINT OR MENTHOL PRODUCTS SO NO COUGH DROPS  USE SUGARLESS CANDY INSTEAD (Jolley ranchers or Stover's or Life Savers) or even ice chips will also do - the key is to swallow to prevent all throat clearing. NO OIL BASED VITAMINS - use powdered substitutes.  Avoid fish oil when coughing.    I will call why I get the CT report back and we'll go from there.

## 2022-08-09 ENCOUNTER — Encounter: Payer: Self-pay | Admitting: Internal Medicine

## 2022-08-09 DIAGNOSIS — R058 Other specified cough: Secondary | ICD-10-CM | POA: Insufficient documentation

## 2022-08-09 NOTE — Assessment & Plan Note (Signed)
CT neck1/25/18 determinate left apical lung nodule. Follow-up non-contrast Chest CT recommended at 3-6 months to confirm persistence. If unchanged, and solid component remains <6 mm, annual CT is recommended until 5 years of stability has been established. - CXR  08/23/2016 not viz  - CT chest 12/25/16  :  RIGHT upper lobe nodule measures 1 mm smaller in six-month interval. - CT chest 02/04/18 : Stable 300 cubic mm part-solid nodule in the left upper lobe. No appreciable change over the last year  - CT chest 01/08/2019    - CT chest 01/13/2020 no change repeat one year > not done as rec - CT chest with contrast   With new upper airway wheezing need to repeat the ct with contrast assuming creat ok s/p L nephrectomy to be sure there are no nodes/ masses in upper airway or AP window contributing to her symptoms.

## 2022-08-09 NOTE — Assessment & Plan Note (Signed)
Onset winter 2022 gradually worse since with prominent upper airway wheeze on exam  Did not respond to albuterol so may be VCD or upper airway obstruction but voice box is intact so looking at subglottic airway should be covered in CT chest ordered.  In meantime rec: Max rx for gerd >>> also   added  Depomedrole 120 mg in case of component of Th-2 driven upper or lower airways inflammation (if cough responds short term only to relapse before return while will on full rx for uacs (as above), then  that would point to allergic rhinitis/ asthma or eos bronchitis as alternative dx)           Each maintenance medication was reviewed in detail including emphasizing most importantly the difference between maintenance and prns and under what circumstances the prns are to be triggered using an action plan format where appropriate.  Total time for H and P, chart review, counseling, reviewing hfa  device(s) and generating customized AVS unique to this office visit / same day charting > 63mn for multiple  refractory respiratory  symptoms of uncertain etiology with pt not seen in > 3y

## 2022-08-21 ENCOUNTER — Encounter: Payer: Self-pay | Admitting: Adult Health

## 2022-08-21 ENCOUNTER — Telehealth: Payer: Self-pay | Admitting: Adult Health

## 2022-08-21 ENCOUNTER — Ambulatory Visit (HOSPITAL_COMMUNITY)
Admission: RE | Admit: 2022-08-21 | Discharge: 2022-08-21 | Disposition: A | Discharge status: Home / Self Care | Payer: Commercial Managed Care - PPO | Source: Ambulatory Visit | Attending: Internal Medicine | Admitting: Internal Medicine

## 2022-08-21 ENCOUNTER — Encounter (HOSPITAL_COMMUNITY): Payer: Self-pay

## 2022-08-21 DIAGNOSIS — R911 Solitary pulmonary nodule: Secondary | ICD-10-CM | POA: Insufficient documentation

## 2022-08-21 MED ORDER — SODIUM CHLORIDE (PF) 0.9 % IJ SOLN
INTRAMUSCULAR | Status: AC
  Filled 2022-08-21: qty 50

## 2022-08-21 MED ORDER — IOHEXOL 300 MG/ML  SOLN
75.0000 mL | Freq: Once | INTRAMUSCULAR | Status: AC | PRN
  Administered 2022-08-21: 75 mL via INTRAVENOUS

## 2022-08-21 NOTE — Progress Notes (Signed)
On call after clinic: call report from radiology CT chest following lung nodule notes, +PE. Patient was called with results. Advised to go to the emergency room for further evaluation. Patient says her symptoms are improved from her last visit two weeks ago she declines going to the emergency room. Patient education was given on what a blood clot is and potential complications of untreated PE. Patient says she will call the office tomor row for an office visit and evaluation with treatment plan will send note to Dr. Melvyn Novas to address first thing in the morning. Again, patient was recommend to go declines.

## 2022-08-21 NOTE — Telephone Encounter (Signed)
See epic note: Call report from Radiology CT w/ contrast : Small acute nonocclusive subsegmental pulmonary embolism in the  right lower lobe posterior basal segment. No evidence of right heart strain.  Patient advised to go to ER . She declined. Advised of potential dangers of untreated PE.  Please contact office for sooner follow up if symptoms do not improve or worsen or seek emergency care    Will send to Dr. Melvyn Novas  to address tomorrow . Please check with him regarding evaluation and treatment plan

## 2022-08-22 ENCOUNTER — Emergency Department (HOSPITAL_BASED_OUTPATIENT_CLINIC_OR_DEPARTMENT_OTHER): Payer: Commercial Managed Care - PPO

## 2022-08-22 ENCOUNTER — Encounter (HOSPITAL_BASED_OUTPATIENT_CLINIC_OR_DEPARTMENT_OTHER): Payer: Self-pay | Admitting: Emergency Medicine

## 2022-08-22 ENCOUNTER — Telehealth: Payer: Self-pay | Admitting: Internal Medicine

## 2022-08-22 ENCOUNTER — Emergency Department (HOSPITAL_BASED_OUTPATIENT_CLINIC_OR_DEPARTMENT_OTHER)
Admission: EM | Admit: 2022-08-22 | Discharge: 2022-08-22 | Disposition: A | Discharge status: Home / Self Care | Payer: Commercial Managed Care - PPO | Attending: Emergency Medicine | Admitting: Emergency Medicine

## 2022-08-22 ENCOUNTER — Other Ambulatory Visit: Payer: Self-pay

## 2022-08-22 ENCOUNTER — Other Ambulatory Visit (HOSPITAL_BASED_OUTPATIENT_CLINIC_OR_DEPARTMENT_OTHER): Payer: Self-pay

## 2022-08-22 ENCOUNTER — Encounter: Payer: Self-pay | Admitting: Internal Medicine

## 2022-08-22 DIAGNOSIS — I2693 Single subsegmental pulmonary embolism without acute cor pulmonale: Secondary | ICD-10-CM | POA: Diagnosis not present

## 2022-08-22 DIAGNOSIS — I82461 Acute embolism and thrombosis of right calf muscular vein: Secondary | ICD-10-CM

## 2022-08-22 DIAGNOSIS — I82491 Acute embolism and thrombosis of other specified deep vein of right lower extremity: Secondary | ICD-10-CM | POA: Insufficient documentation

## 2022-08-22 DIAGNOSIS — Z7901 Long term (current) use of anticoagulants: Secondary | ICD-10-CM | POA: Insufficient documentation

## 2022-08-22 DIAGNOSIS — R0602 Shortness of breath: Secondary | ICD-10-CM | POA: Diagnosis present

## 2022-08-22 DIAGNOSIS — I2699 Other pulmonary embolism without acute cor pulmonale: Secondary | ICD-10-CM | POA: Insufficient documentation

## 2022-08-22 LAB — CBC WITH DIFFERENTIAL/PLATELET
Abs Immature Granulocytes: 0.02 10*3/uL (ref 0.00–0.07)
Basophils Absolute: 0.1 10*3/uL (ref 0.0–0.1)
Basophils Relative: 1 %
Eosinophils Absolute: 0.2 10*3/uL (ref 0.0–0.5)
Eosinophils Relative: 3 %
HCT: 35.9 % — ABNORMAL LOW (ref 36.0–46.0)
Hemoglobin: 11.3 g/dL — ABNORMAL LOW (ref 12.0–15.0)
Immature Granulocytes: 0 %
Lymphocytes Relative: 31 %
Lymphs Abs: 2.4 10*3/uL (ref 0.7–4.0)
MCH: 24.5 pg — ABNORMAL LOW (ref 26.0–34.0)
MCHC: 31.5 g/dL (ref 30.0–36.0)
MCV: 77.9 fL — ABNORMAL LOW (ref 80.0–100.0)
Monocytes Absolute: 0.6 10*3/uL (ref 0.1–1.0)
Monocytes Relative: 8 %
Neutro Abs: 4.3 10*3/uL (ref 1.7–7.7)
Neutrophils Relative %: 57 %
Platelets: 374 10*3/uL (ref 150–400)
RBC: 4.61 MIL/uL (ref 3.87–5.11)
RDW: 14.9 % (ref 11.5–15.5)
WBC: 7.5 10*3/uL (ref 4.0–10.5)
nRBC: 0 % (ref 0.0–0.2)

## 2022-08-22 LAB — BASIC METABOLIC PANEL
Anion gap: 8 (ref 5–15)
BUN: 19 mg/dL (ref 8–23)
CO2: 26 mmol/L (ref 22–32)
Calcium: 8.9 mg/dL (ref 8.9–10.3)
Chloride: 106 mmol/L (ref 98–111)
Creatinine, Ser: 0.97 mg/dL (ref 0.44–1.00)
GFR, Estimated: >60 mL/min (ref >60)
Glucose, Bld: 111 mg/dL — ABNORMAL HIGH (ref 70–99)
Potassium: 3.5 mmol/L (ref 3.5–5.1)
Sodium: 140 mmol/L (ref 135–145)

## 2022-08-22 LAB — TROPONIN I (HIGH SENSITIVITY): Troponin I (High Sensitivity): <2 ng/L (ref <18)

## 2022-08-22 MED ORDER — RIVAROXABAN (XARELTO) VTE STARTER PACK (15 & 20 MG)
ORAL_TABLET | ORAL | 0 refills | Status: DC
  Filled 2022-08-22: qty 51, 30d supply, fill #0

## 2022-08-22 NOTE — Telephone Encounter (Signed)
See phone note 08/22/2022

## 2022-08-22 NOTE — ED Provider Notes (Signed)
[facility] Provider Note   CSN: [id number] Arrival date & time: 08/22/22  1048     History  Chief Complaint  Patient presents with   Shortness of Breath    Christy [name] is a 62 y.o. female.  Patient is a 62 year old female with a history of migraines, several years of worsening trouble with her breathing with intermittent coughing and wheezing who is presenting today due to an abnormal CT scan.  Patient reports that she went and saw pulmonologist 2 weeks ago and at that time was started on Protonix and a steroid.  She has been doing exactly what the doctor told her and has noticed that improvement in the wheezing and the coughing she been experiencing but she was called last night because her CAT scan showed that she had a PE.  She has not noticed any leg swelling or discomfort, she does think she has a family history of multiple blood clots both in her mother and her brother may have died from a blood clot.  She has never had blood clots in the past.  She is not having any abdominal pain, chest pain and denies any shortness of breath at this time.  She has not had any fevers.  She has no prior history of internal or GI bleeding.  The history is provided by the patient and medical records.  Shortness of Breath      Home Medications Prior to Admission medications   Medication Sig Start Date End Date Taking? Authorizing Provider  RIVAROXABAN Alveda Reasons) VTE STARTER PACK (15 & 20 MG) Follow package directions: Take one '15mg'$  tablet by mouth twice a day. On day 22, switch to one '20mg'$  tablet once a day. Take with food. 08/22/22  Yes Yena Tisby, Loree Fee, MD  albuterol (VENTOLIN HFA) 108 (90 Base) MCG/ACT inhaler Inhale 2 puffs into the lungs every 4 (four) hours as needed for wheezing or shortness of breath. 07/19/22   [provider]  cetirizine (ZYRTEC) 10 MG tablet Take 10 mg by mouth daily. 07/19/22   [provider]  famotidine  (PEPCID) 20 MG tablet One after supper 08/08/22   Tanda Rockers, MD  pantoprazole (PROTONIX) 40 MG tablet Take 1 tablet (40 mg total) by mouth daily. Take 30-60 min before first meal of the day 08/08/22   Tanda Rockers, MD  VITAMIN D3 10 MCG (400 UNIT) tablet Take 800 Units by mouth daily. 08/08/22   [provider]      Allergies    Penicillins    Review of Systems   Review of Systems  Respiratory:  Positive for shortness of breath.     Physical Exam Updated Vital Signs BP (!) 154/72 (BP Location: Left Arm)   Pulse 96   Temp 98.7 F (37.1 C) (Oral)   Resp 19   Ht '5\' 1"'$  (1.549 m)   Wt 77.1 kg   LMP 05/10/2011   SpO2 97%   BMI 32.12 kg/m  Physical Exam Vitals and nursing note reviewed.  Constitutional:      General: She is not in acute distress.    Appearance: She is well-developed.  HENT:     Head: Normocephalic and atraumatic.  Eyes:     Pupils: Pupils are equal, round, and reactive to light.  Cardiovascular:     Rate and Rhythm: Normal rate and regular rhythm.     Heart sounds: Normal heart sounds. No murmur heard.    No friction rub.  Pulmonary:  Effort: Pulmonary effort is normal.     Breath sounds: Normal breath sounds. No wheezing or rales.  Abdominal:     General: Bowel sounds are normal. There is no distension.     Palpations: Abdomen is soft.     Tenderness: There is no abdominal tenderness. There is no guarding or rebound.  Musculoskeletal:        General: No tenderness. Normal range of motion.     Right lower leg: No edema.     Left lower leg: No edema.     Comments: No edema  Skin:    General: Skin is warm and dry.     Findings: No rash.  Neurological:     Mental Status: She is alert and oriented to person, place, and time. Mental status is at baseline.     Cranial Nerves: No cranial nerve deficit.  Psychiatric:        Behavior: Behavior normal.     ED Results / Procedures / Treatments   Labs (all labs ordered are listed, but  only abnormal results are displayed) Labs Reviewed  CBC WITH DIFFERENTIAL/PLATELET - Abnormal; Notable for the following components:      Result Value   Hemoglobin 11.3 (*)    HCT 35.9 (*)    MCV 77.9 (*)    MCH 24.5 (*)    All other components within normal limits  BASIC METABOLIC PANEL - Abnormal; Notable for the following components:   Glucose, Bld 111 (*)    All other components within normal limits  TROPONIN I (HIGH SENSITIVITY)    EKG EKG Interpretation  Date/Time:  Tuesday August 22 2022 11:11:38 EST Ventricular Rate:  95 PR Interval:  145 QRS Duration: 82 QT Interval:  367 QTC Calculation: 462 R Axis:   32 Text Interpretation: Sinus rhythm Normal ECG Confirmed by Blanchie Dessert [id number]) on 08/22/2022 11:21:11 AM  Radiology US Venous Img Lower Bilateral (DVT)  Result Date: 08/22/2022 CLINICAL DATA:  Pulmonary embolism.  Evaluate for DVT. EXAM: BILATERAL LOWER EXTREMITY VENOUS DOPPLER ULTRASOUND TECHNIQUE: Gray-scale sonography with graded compression, as well as color Doppler and duplex ultrasound were performed to evaluate the lower extremity deep venous systems from the level of the common femoral vein and including the common femoral, femoral, profunda femoral, popliteal and calf veins including the posterior tibial, peroneal and gastrocnemius veins when visible. The superficial great saphenous vein was also interrogated. Spectral Doppler was utilized to evaluate flow at rest and with distal augmentation maneuvers in the common femoral, femoral and popliteal veins. COMPARISON:  CTA chest 08/21/2022 FINDINGS: RIGHT LOWER EXTREMITY Common Femoral Vein: No evidence of thrombus. Normal compressibility, respiratory phasicity and response to augmentation. Saphenofemoral Junction: No evidence of thrombus. Normal compressibility and flow on color Doppler imaging. Profunda Femoral Vein: No evidence of thrombus. Normal compressibility and flow on color Doppler imaging. Femoral Vein: No  evidence of thrombus. Normal compressibility, respiratory phasicity and response to augmentation. Popliteal Vein: No evidence of thrombus. Normal compressibility, respiratory phasicity and response to augmentation. Calf Veins: Positive for thrombus. A posterior tibial vein in the mid calf does not compress but there is color Doppler flow in this area. Findings suggestive for nonocclusive thrombus. Superficial Great Saphenous Vein: No evidence of thrombus. Normal compressibility. Other Findings:  None. LEFT LOWER EXTREMITY Common Femoral Vein: No evidence of thrombus. Normal compressibility, respiratory phasicity and response to augmentation. Saphenofemoral Junction: No evidence of thrombus. Normal compressibility and flow on color Doppler imaging. Profunda Femoral Vein: No evidence of thrombus. Normal  compressibility and flow on color Doppler imaging. Femoral Vein: No evidence of thrombus. Normal compressibility, respiratory phasicity and response to augmentation. Popliteal Vein: No evidence of thrombus. Normal compressibility, respiratory phasicity and response to augmentation. Calf Veins: No evidence of thrombus. Normal compressibility and flow on color Doppler imaging. Superficial Great Saphenous Vein: No evidence of thrombus. Normal compressibility. Other Findings:  None. IMPRESSION: 1. Positive for deep venous thrombosis in the right lower extremity. Thrombus involving a right posterior tibial vein in the mid calf. 2. Negative for deep venous thrombosis in left lower extremity. Electronically Signed   By: Markus Daft M.D.   On: 08/22/2022 13:39   CT Chest W Contrast  Addendum Date: 08/21/2022   ADDENDUM REPORT: 08/21/2022 18:51 ADDENDUM: Critical Value/emergent results were called by telephone on 08/21/2022 at 6:50 pm to provider [facility] PARRETT NP, who verbally acknowledged these results. Electronically Signed   By: Titus Dubin M.D.   On: 08/21/2022 18:51   Result Date: 08/21/2022 CLINICAL DATA:  Pulmonary  nodule follow-up. EXAM: CT CHEST WITH CONTRAST TECHNIQUE: Multidetector CT imaging of the chest was performed during intravenous contrast administration. RADIATION DOSE REDUCTION: This exam was performed according to the departmental dose-optimization program which includes automated exposure control, adjustment of the mA and/or kV according to patient size and/or use of iterative reconstruction technique. CONTRAST:  71m OMNIPAQUE IOHEXOL 300 MG/ML  SOLN COMPARISON:  CT chest dated January 13, 2020. FINDINGS: Cardiovascular: Small acute nonocclusive subsegmental pulmonary embolism in the right lower lobe posterior basal segment (series 2, image 84). No additional pulmonary emboli. Normal heart size. No pericardial effusion. No thoracic aortic aneurysm or dissection. Mediastinum/Nodes: No enlarged mediastinal, hilar, or axillary lymph nodes. Thyroid gland, trachea, and esophagus demonstrate no significant findings. Lungs/Pleura: Mixed solid and subsolid nodule in the left upper lobe is unchanged in size, with the ground-glass component measuring 7 x 9 mm, previously 7 x 9 mm, and the solid component measuring 4 x 6 mm, previously 4 x 6 mm. This is not significantly changed dating back to January 2018, consistent with benign etiology. No new lung nodule. No focal consolidation, pleural effusion, or pneumothorax. Upper Abdomen: No acute abnormality. Musculoskeletal: No chest wall abnormality. No acute or significant osseous findings. IMPRESSION: 1. Small acute nonocclusive subsegmental pulmonary embolism in the right lower lobe posterior basal segment. No evidence of right heart strain. 2. Unchanged mixed solid and subsolid nodule in the left upper lobe dating back to January 2018, consistent with benign etiology. No further follow-up imaging is recommended. Radiology assistant personnel have been notified to put me in telephone contact with the referring physician or the referring physician's clinical representative in  order to discuss these findings. Once this communication is established I will issue an addendum to this report for documentation purposes. Electronically Signed: By: WTitus DubinM.D. On: 08/21/2022 18:22    Procedures Procedures    Medications Ordered in ED Medications - No data to display  ED Course/ Medical Decision Making/ A&P                             Medical Decision Making Amount and/or Complexity of Data Reviewed External Data Reviewed: notes.    Details: pulm Labs: ordered. Decision-making details documented in ED Course. Radiology: ordered and independent interpretation performed. Decision-making details documented in ED Course. ECG/medicine tests: ordered and independent interpretation performed. Decision-making details documented in ED Course.  Risk Prescription drug management.   Pt with multiple  medical problems and comorbidities and presenting today with a complaint that caries a high risk for morbidity and mortality.  Here today with complaint of persistent exertional dyspnea that she recently followed up with pulmonologist for and a CT scan done yesterday showing a subsegmental nonocclusive acute PE.  No other acute findings were found on her CT.  Patient is well-appearing on exam does not appear short of breath and is not tachycardic.  She has no unilateral leg swelling.  No prior history of clots.  Will do bilateral Dopplers to rule out evidence of DVT.  Will check electrolytes and renal function.  Also will ensure no significant anemia.  Feel that patient otherwise is well-appearing.  I independently interpreted her EKG which shows no acute findings at this time.  Feel that it would be reasonable to start patient on Eliquis, discharge home and have her follow-up with PCP for further testing.  2:08 PM I independently interpreted patient's labs. CBC and BMP within normal limits today.  I have independently visualized and interpreted pt's images today.  Bilateral  DVT scan is positive for right DVT despite patient's lack of pain she has a right lower extremity thrombus involving the right posterior tibial vein in the mid calf.  Discussed blood thinners with the patient's risk and benefits.  Patient would prefer Xarelto due to its once a day dosing.  Discussed with her beginning starting with twice daily dosing but then going down to once a day.  She will follow-up with her PCP for further testing given her family history of clots and follow-up with pulmonology as planned.  She will continue the current medication she is on.         Final Clinical Impression(s) / ED Diagnoses Final diagnoses:  Acute deep vein thrombosis (DVT) of calf muscle vein of right lower extremity (HCC)  Single subsegmental pulmonary embolism without acute cor pulmonale (HCC)    Rx / DC Orders ED Discharge Orders          Ordered    RIVAROXABAN (XARELTO) VTE STARTER PACK (15 & 20 MG)        08/22/22 1406              Blanchie Dessert, MD 08/22/22 1410

## 2022-08-22 NOTE — ED Triage Notes (Signed)
Pt reports SHOB. Pt stating she was sent by her pulmonologist for a PE. Pt had CT yesterday that showed PE. Pt denies leg swelling or pain.

## 2022-08-22 NOTE — Telephone Encounter (Signed)
Arrived back of November from long trip to [place] and after returned noted doe and better since ov but CT with contrast 08/21/22 pm showed small peripheral R PE  She likely has more clots than seen on a non PE protocol but is feeling fine with no leg symptoms.  She needs immediate eval for bmet/ cbc/ venous dopplers and start on DOAC her insurance will improve so rec go to ER asap.  Mother had blood clots so will also need hypercoagulability profile at some point.  We need f/u appt with her in 4 weeks

## 2022-08-22 NOTE — Telephone Encounter (Signed)
Routing to Dr. Melvyn Novas

## 2022-08-22 NOTE — Discharge Instructions (Addendum)
Wear compression socks for blood clot.  You were started on a blood thinner.  You need to take it every evening with food.  If you start noticing blood in your stool, excessive bleeding or other concerns you should stop the blood thinner and return to the emergency room.

## 2022-08-23 NOTE — Telephone Encounter (Signed)
Spoke with the pt  She went to ED and had [place] and labs  Pos for DVT and was started on Xarelto  She was able to get her medication and has started taking this  I have scheduled her for ov with TP for 09/18/22  Advised to call sooner if needed

## 2022-08-25 LAB — POCT I-STAT CREATININE: Creatinine, Ser: 1 mg/dL (ref 0.44–1.00)

## 2022-09-14 ENCOUNTER — Other Ambulatory Visit (HOSPITAL_BASED_OUTPATIENT_CLINIC_OR_DEPARTMENT_OTHER): Payer: Self-pay

## 2022-09-18 ENCOUNTER — Telehealth: Payer: Self-pay | Admitting: Hematology and Oncology

## 2022-09-18 ENCOUNTER — Ambulatory Visit (INDEPENDENT_AMBULATORY_CARE_PROVIDER_SITE_OTHER): Payer: Commercial Managed Care - PPO | Admitting: Adult Health

## 2022-09-18 ENCOUNTER — Encounter: Payer: Self-pay | Admitting: Adult Health

## 2022-09-18 VITALS — BP 126/70 | HR 81 | Temp 98.0°F | Ht 61.0 in | Wt 171.6 lb

## 2022-09-18 DIAGNOSIS — R911 Solitary pulmonary nodule: Secondary | ICD-10-CM | POA: Diagnosis not present

## 2022-09-18 DIAGNOSIS — G8929 Other chronic pain: Secondary | ICD-10-CM

## 2022-09-18 DIAGNOSIS — I2699 Other pulmonary embolism without acute cor pulmonale: Secondary | ICD-10-CM | POA: Diagnosis not present

## 2022-09-18 DIAGNOSIS — Z8249 Family history of ischemic heart disease and other diseases of the circulatory system: Secondary | ICD-10-CM | POA: Diagnosis not present

## 2022-09-18 DIAGNOSIS — M549 Dorsalgia, unspecified: Secondary | ICD-10-CM | POA: Insufficient documentation

## 2022-09-18 DIAGNOSIS — R0600 Dyspnea, unspecified: Secondary | ICD-10-CM | POA: Insufficient documentation

## 2022-09-18 DIAGNOSIS — R0609 Other forms of dyspnea: Secondary | ICD-10-CM | POA: Diagnosis not present

## 2022-09-18 DIAGNOSIS — M5441 Lumbago with sciatica, right side: Secondary | ICD-10-CM

## 2022-09-18 DIAGNOSIS — R058 Other specified cough: Secondary | ICD-10-CM

## 2022-09-18 MED ORDER — RIVAROXABAN 20 MG PO TABS: 20.0000 mg | ORAL_TABLET | Freq: Every day | ORAL | 3 refills | Status: DC

## 2022-09-18 NOTE — Assessment & Plan Note (Signed)
Recently diagnosed acute PE  and Right DVT questionable provoked with extensive travel as her risk factor /Mild obesity. Also patient has a significant family history.  We discussed in detail continuing on Xarelto 20 mg daily for now.  She has completed her starter pack.  Currently tolerating Xarelto 20 mg daily.  She will continue on this for at least 6 months.  Will refer him to hematology for further workup to see if patient will need lifelong anticoagulation therapy.   Plan  Patient Instructions  Set up for 2 D echo  Continue on Xarelto 20mg  daily Avoid NSAIDs,(No Advil, motrin , aleve, ibuprofen , etc) .  Refer to Hematology.  Alternate ice and heat to low back  Tylenol As needed  pain .  If back and leg pain not improving call back -will refer to ortho   Continue on Protonix 40mg  daily  Continue on Pepcid 20mg  At bedtime   Albuterol inhaler As needed   Zyrtec 10mg  daily As needed  drainage.  Follow up with in 6-8 weeks with Dr. Melvyn Novas  with PFT  Please contact office for sooner follow up if symptoms do not improve or worsen or seek emergency care

## 2022-09-18 NOTE — Assessment & Plan Note (Signed)
CT chest showed stable nodule consistent with a benign etiology

## 2022-09-18 NOTE — Telephone Encounter (Signed)
scheduled per 4/1 referral, pt has been called and confirmed date and time. Pt is aware of location and to arrive early for check in

## 2022-09-18 NOTE — Assessment & Plan Note (Signed)
Upper airway cough syndrome plus or minus reactive airways.  Symptoms are improved on GERD therapy.  Add in Zyrtec as needed for postnasal drainage.  Check PFTs on return Albuterol inhaler as needed  Plan Patient Instructions  Set up for 2 D echo  Continue on Xarelto 20mg  daily Avoid NSAIDs,(No Advil, motrin , aleve, ibuprofen , etc) .  Refer to Hematology.  Alternate ice and heat to low back  Tylenol As needed  pain .  If back and leg pain not improving call back -will refer to ortho   Continue on Protonix 40mg  daily  Continue on Pepcid 20mg  At bedtime   Albuterol inhaler As needed   Zyrtec 10mg  daily As needed  drainage.  Follow up with in 6-8 weeks with Dr. Melvyn Novas  with PFT  Please contact office for sooner follow up if symptoms do not improve or worsen or seek emergency care

## 2022-09-18 NOTE — Patient Instructions (Addendum)
Set up for 2 D echo  Continue on Xarelto 20mg  daily Avoid NSAIDs,(No Advil, motrin , aleve, ibuprofen , etc) .  Refer to Hematology.  Alternate ice and heat to low back  Tylenol As needed  pain .  If back and leg pain not improving call back -will refer to ortho   Continue on Protonix 40mg  daily  Continue on Pepcid 20mg  At bedtime   Albuterol inhaler As needed   Zyrtec 10mg  daily As needed  drainage.  Follow up with in 6-8 weeks with Dr. Melvyn Novas  with PFT  Please contact office for sooner follow up if symptoms do not improve or worsen or seek emergency care

## 2022-09-18 NOTE — Assessment & Plan Note (Signed)
Right-sided sciatica pain.-Advised to use ice and heat.  Tylenol as needed.  If symptoms do not improve will need referral to orthopedics.  Avoid nonsteroidals.

## 2022-09-18 NOTE — Assessment & Plan Note (Signed)
Chronic dyspnea with exertion.  This has been going on for a few years now.  Acute symptoms definitely seem to be related to her recent diagnosis of PE.  Previous spirometry showed mild obstruction only.  Will check PFTs on return.  CT chest showed no evidence of right heart strain but will check a 2D echo to rule out and also evaluate for possible underlying pulmonary hypertension.  O2 saturations are very good today at 99% on room air.  Emergency room labs were reviewed.  CBC showed only mild anemia.  Plan  Patient Instructions  Set up for 2 D echo  Continue on Xarelto 20mg  daily Avoid NSAIDs,(No Advil, motrin , aleve, ibuprofen , etc) .  Refer to Hematology.  Alternate ice and heat to low back  Tylenol As needed  pain .  If back and leg pain not improving call back -will refer to ortho   Continue on Protonix 40mg  daily  Continue on Pepcid 20mg  At bedtime   Albuterol inhaler As needed   Zyrtec 10mg  daily As needed  drainage.  Follow up with in 6-8 weeks with Dr. Melvyn Novas  with PFT  Please contact office for sooner follow up if symptoms do not improve or worsen or seek emergency care

## 2022-09-18 NOTE — Progress Notes (Signed)
@Patient  ID: Christy [name], female    DOB: 1961-05-18, 62 y.o.   MRN: [id number]  Chief Complaint  Patient presents with   Follow-up    Referring provider: Maximiano Coss, NP  HPI: 62 year old female never smoker seen for pulmonary consult February 2024 for shortness of breath. Previously seen in 2018 for pulmonary nodule followed on serial CT scan   TEST/EVENTS :   09/18/2022 Follow up : PE, DVT , Dyspnea  Patient presents for a follow-up visit.  Patient was seen for pulmonary consult August 08, 2022.  She complained of ongoing shortness of breath that is worsened over the last year.  She says over the last couple years she has noticed that she has become more short of breath with activity with associated cough and wheezing.  She had had multiple trips over the past year traveling to [place], going to, [place].  Most recent air travel was in November 2023.  Patient had noticed that she was having shortness of breath walking short distances.  She is also had some car travel to [place].  Patient had previously been seen in 2018 for a pulmonary nodule.  This had been followed on serial CT scan with no significant change.  CT chest was ordered last visit and completed on August 21, 2022.  This showed a small acute nonocclusive subsegmental pulmonary embolism.  In the right lower lobe.  An unchanged mixed solid and subsolid nodule in the left upper lobe dating back to 2018 consistent with a benign etiology.  Patient was referred to the emergency room for evaluation.-Patient was started on a Xarelto starter pack.  Venous Doppler showed a right lower extremity DVT.  Last visit she was also started on Protonix and Pepcid for possible GERD component.  And continued on albuterol for possible asthma component.  Patient says her cough and wheezing is substantially improved.  She has noticed a slight decrease in shortness of breath with activity since starting on Xarelto.   Patient does tell me that she has a  significant family history of blood clots.  Her brother died last year after having surgery from a blood clot.  Her mother also died a few years ago from a blood clot.  Patient says she travels extensively for her work.  Patient has never had a blood clot previously. Patient complains of right lower back and buttocks pain with pain radiating down into the leg.  This has been going on for several months.  Pain is worsened if she moves her legs side-to-side or tries to raise it.  She has no bladder or bowel incontinence.  No leg weakness.  No rash. She has taken Tylenol for pain.   Allergies  Allergen Reactions   Penicillins Hives    Immunization History  Administered Date(s) Administered   Influenza-Unspecified 03/11/2020   PFIZER(Purple Top)SARS-COV-2 Vaccination 09/20/2019, 10/14/2019, 05/22/2020    Past Medical History:  Diagnosis Date   Allergy    Migraine aura without headache 1998    Tobacco History: Social History   Tobacco Use  Smoking Status Never  Smokeless Tobacco Never   Counseling given: Not Answered   Outpatient Medications Prior to Visit  Medication Sig Dispense Refill   albuterol (VENTOLIN HFA) 108 (90 Base) MCG/ACT inhaler Inhale 2 puffs into the lungs every 4 (four) hours as needed for wheezing or shortness of breath.     cetirizine (ZYRTEC) 10 MG tablet Take 10 mg by mouth daily.     famotidine (PEPCID) 20 MG tablet One  after supper 30 tablet 11   pantoprazole (PROTONIX) 40 MG tablet Take 1 tablet (40 mg total) by mouth daily. Take 30-60 min before first meal of the day 30 tablet 2   RIVAROXABAN (XARELTO) VTE STARTER PACK (15 & 20 MG) Follow package directions: Take one 15mg  tablet by mouth twice a day. On day 22, switch to one 20mg  tablet once a day. Take with food. 51 each 0   VITAMIN D3 10 MCG (400 UNIT) tablet Take 800 Units by mouth daily.     No facility-administered medications prior to visit.     Review of Systems:   Constitutional:   No   weight loss, night sweats,  Fevers, chills, fatigue, or  lassitude.  HEENT:   No headaches,  Difficulty swallowing,  Tooth/dental problems, or  Sore throat,                No sneezing, itching, ear ache, nasal congestion, post nasal drip,   CV:  No chest pain,  Orthopnea, PND, swelling in lower extremities, anasarca, dizziness, palpitations, syncope.   GI  No heartburn, indigestion, abdominal pain, nausea, vomiting, diarrhea, change in bowel habits, loss of appetite, bloody stools.   Resp:  No excess mucus, no productive cough,  No non-productive cough,  No coughing up of blood.  No change in color of mucus.  No wheezing.  No chest wall deformity  Skin: no rash or lesions.  GU: no dysuria, change in color of urine, no urgency or frequency.  No flank pain, no hematuria   MS: Back and leg pain on the right side   Physical Exam  BP 126/70 (BP Location: Left Arm, Patient Position: Sitting, Cuff Size: Normal)   Pulse 81   Temp 98 F (36.7 C) (Oral)   Ht 5\' 1"  (1.549 m)   Wt 171 lb 9.6 oz (77.8 kg)   LMP 05/10/2011   SpO2 99%   BMI 32.42 kg/m   GEN: A/Ox3; pleasant , NAD, well nourished    HEENT:  [place]/AT,  EACs-clear, TMs-wnl, NOSE-clear, THROAT-clear, no lesions, no postnasal drip or exudate noted.   NECK:  Supple w/ fair ROM; no JVD; normal carotid impulses w/o bruits; no thyromegaly or nodules palpated; no lymphadenopathy.    RESP  Clear  P & A; w/o, wheezes/ rales/ or rhonchi. no accessory muscle use, no dullness to percussion  CARD:  RRR, no m/r/g, no peripheral edema, pulses intact, no cyanosis or clubbing.  Positive calf tenderness.  GI:   Soft & nt; nml bowel sounds; no organomegaly or masses detected.   Musco: Warm bil, no deformities or joint swelling noted.  Tenderness along the right lower back and upper buttocks area.  Normal range of motion normal strength.  Normal gait.  Neuro: alert, no focal deficits noted.    Skin: Warm, no lesions or rashes    Lab  Results:  CBC    Component Value Date/Time   WBC 7.5 08/22/2022 1123   RBC 4.61 08/22/2022 1123   HGB 11.3 (L) 08/22/2022 1123   HCT 35.9 (L) 08/22/2022 1123   PLT 374 08/22/2022 1123   MCV 77.9 (L) 08/22/2022 1123   MCH 24.5 (L) 08/22/2022 1123   MCHC 31.5 08/22/2022 1123   RDW 14.9 08/22/2022 1123   LYMPHSABS 2.4 08/22/2022 1123   MONOABS 0.6 08/22/2022 1123   EOSABS 0.2 08/22/2022 1123   BASOSABS 0.1 08/22/2022 1123    BMET    Component Value Date/Time   NA 140 08/22/2022 1123  K 3.5 08/22/2022 1123   CL 106 08/22/2022 1123   CO2 26 08/22/2022 1123   GLUCOSE 111 (H) 08/22/2022 1123   BUN 19 08/22/2022 1123   CREATININE 0.97 08/22/2022 1123   CREATININE 1.06 10/30/2014 1316   CALCIUM 8.9 08/22/2022 1123   GFRNONAA >60 08/22/2022 1123   GFRAA 61 (L) 03/22/2012 1231    BNP No results found for: "BNP"  ProBNP No results found for: "PROBNP"  Imaging: US Venous Img Lower Bilateral (DVT)  Result Date: 08/22/2022 CLINICAL DATA:  Pulmonary embolism.  Evaluate for DVT. EXAM: BILATERAL LOWER EXTREMITY VENOUS DOPPLER ULTRASOUND TECHNIQUE: Gray-scale sonography with graded compression, as well as color Doppler and duplex ultrasound were performed to evaluate the lower extremity deep venous systems from the level of the common femoral vein and including the common femoral, femoral, profunda femoral, popliteal and calf veins including the posterior tibial, peroneal and gastrocnemius veins when visible. The superficial great saphenous vein was also interrogated. Spectral Doppler was utilized to evaluate flow at rest and with distal augmentation maneuvers in the common femoral, femoral and popliteal veins. COMPARISON:  CTA chest 08/21/2022 FINDINGS: RIGHT LOWER EXTREMITY Common Femoral Vein: No evidence of thrombus. Normal compressibility, respiratory phasicity and response to augmentation. Saphenofemoral Junction: No evidence of thrombus. Normal compressibility and flow on color  Doppler imaging. Profunda Femoral Vein: No evidence of thrombus. Normal compressibility and flow on color Doppler imaging. Femoral Vein: No evidence of thrombus. Normal compressibility, respiratory phasicity and response to augmentation. Popliteal Vein: No evidence of thrombus. Normal compressibility, respiratory phasicity and response to augmentation. Calf Veins: Positive for thrombus. A posterior tibial vein in the mid calf does not compress but there is color Doppler flow in this area. Findings suggestive for nonocclusive thrombus. Superficial Great Saphenous Vein: No evidence of thrombus. Normal compressibility. Other Findings:  None. LEFT LOWER EXTREMITY Common Femoral Vein: No evidence of thrombus. Normal compressibility, respiratory phasicity and response to augmentation. Saphenofemoral Junction: No evidence of thrombus. Normal compressibility and flow on color Doppler imaging. Profunda Femoral Vein: No evidence of thrombus. Normal compressibility and flow on color Doppler imaging. Femoral Vein: No evidence of thrombus. Normal compressibility, respiratory phasicity and response to augmentation. Popliteal Vein: No evidence of thrombus. Normal compressibility, respiratory phasicity and response to augmentation. Calf Veins: No evidence of thrombus. Normal compressibility and flow on color Doppler imaging. Superficial Great Saphenous Vein: No evidence of thrombus. Normal compressibility. Other Findings:  None. IMPRESSION: 1. Positive for deep venous thrombosis in the right lower extremity. Thrombus involving a right posterior tibial vein in the mid calf. 2. Negative for deep venous thrombosis in left lower extremity. Electronically Signed   By: Markus Daft M.D.   On: 08/22/2022 13:39   CT Chest W Contrast  Addendum Date: 08/21/2022   ADDENDUM REPORT: 08/21/2022 18:51 ADDENDUM: Critical Value/emergent results were called by telephone on 08/21/2022 at 6:50 pm to provider [facility] Julizza Sassone NP, who verbally acknowledged  these results. Electronically Signed   By: Titus Dubin M.D.   On: 08/21/2022 18:51   Result Date: 08/21/2022 CLINICAL DATA:  Pulmonary nodule follow-up. EXAM: CT CHEST WITH CONTRAST TECHNIQUE: Multidetector CT imaging of the chest was performed during intravenous contrast administration. RADIATION DOSE REDUCTION: This exam was performed according to the departmental dose-optimization program which includes automated exposure control, adjustment of the mA and/or kV according to patient size and/or use of iterative reconstruction technique. CONTRAST:  44mL OMNIPAQUE IOHEXOL 300 MG/ML  SOLN COMPARISON:  CT chest dated January 13, 2020. FINDINGS: Cardiovascular:  Small acute nonocclusive subsegmental pulmonary embolism in the right lower lobe posterior basal segment (series 2, image 84). No additional pulmonary emboli. Normal heart size. No pericardial effusion. No thoracic aortic aneurysm or dissection. Mediastinum/Nodes: No enlarged mediastinal, hilar, or axillary lymph nodes. Thyroid gland, trachea, and esophagus demonstrate no significant findings. Lungs/Pleura: Mixed solid and subsolid nodule in the left upper lobe is unchanged in size, with the ground-glass component measuring 7 x 9 mm, previously 7 x 9 mm, and the solid component measuring 4 x 6 mm, previously 4 x 6 mm. This is not significantly changed dating back to January 2018, consistent with benign etiology. No new lung nodule. No focal consolidation, pleural effusion, or pneumothorax. Upper Abdomen: No acute abnormality. Musculoskeletal: No chest wall abnormality. No acute or significant osseous findings. IMPRESSION: 1. Small acute nonocclusive subsegmental pulmonary embolism in the right lower lobe posterior basal segment. No evidence of right heart strain. 2. Unchanged mixed solid and subsolid nodule in the left upper lobe dating back to January 2018, consistent with benign etiology. No further follow-up imaging is recommended. Radiology assistant  personnel have been notified to put me in telephone contact with the referring physician or the referring physician's clinical representative in order to discuss these findings. Once this communication is established I will issue an addendum to this report for documentation purposes. Electronically Signed: By: Titus Dubin M.D. On: 08/21/2022 18:22    methylPREDNISolone acetate (DEPO-MEDROL) injection 120 mg     Date Action Dose Route User   Discharged on 08/22/2022   Admitted on 08/22/2022   08/08/2022 1728 Given 120 mg Intramuscular (Right Ventrogluteal) Oralia Rud M, CMA           No data to display          No results found for: "NITRICOXIDE"      Assessment & Plan:   Pulmonary embolism Recently diagnosed acute PE  and Right DVT questionable provoked with extensive travel as her risk factor /Mild obesity. Also patient has a significant family history.  We discussed in detail continuing on Xarelto 20 mg daily for now.  She has completed her starter pack.  Currently tolerating Xarelto 20 mg daily.  She will continue on this for at least 6 months.  Will refer him to hematology for further workup to see if patient will need lifelong anticoagulation therapy.   Plan  Patient Instructions  Set up for 2 D echo  Continue on Xarelto 20mg  daily Avoid NSAIDs,(No Advil, motrin , aleve, ibuprofen , etc) .  Refer to Hematology.  Alternate ice and heat to low back  Tylenol As needed  pain .  If back and leg pain not improving call back -will refer to ortho   Continue on Protonix 40mg  daily  Continue on Pepcid 20mg  At bedtime   Albuterol inhaler As needed   Zyrtec 10mg  daily As needed  drainage.  Follow up with in 6-8 weeks with Dr. Melvyn Novas  with PFT  Please contact office for sooner follow up if symptoms do not improve or worsen or seek emergency care      Dyspnea on exertion Chronic dyspnea with exertion.  This has been going on for a few years now.  Acute symptoms definitely seem  to be related to her recent diagnosis of PE.  Previous spirometry showed mild obstruction only.  Will check PFTs on return.  CT chest showed no evidence of right heart strain but will check a 2D echo to rule out and also evaluate  for possible underlying pulmonary hypertension.  O2 saturations are very good today at 99% on room air.  Emergency room labs were reviewed.  CBC showed only mild anemia.  Plan  Patient Instructions  Set up for 2 D echo  Continue on Xarelto 20mg  daily Avoid NSAIDs,(No Advil, motrin , aleve, ibuprofen , etc) .  Refer to Hematology.  Alternate ice and heat to low back  Tylenol As needed  pain .  If back and leg pain not improving call back -will refer to ortho   Continue on Protonix 40mg  daily  Continue on Pepcid 20mg  At bedtime   Albuterol inhaler As needed   Zyrtec 10mg  daily As needed  drainage.  Follow up with in 6-8 weeks with Dr. Melvyn Novas  with PFT  Please contact office for sooner follow up if symptoms do not improve or worsen or seek emergency care        Solitary pulmonary nodule CT chest showed stable nodule consistent with a benign etiology  Upper airway cough syndrome Upper airway cough syndrome plus or minus reactive airways.  Symptoms are improved on GERD therapy.  Add in Zyrtec as needed for postnasal drainage.  Check PFTs on return Albuterol inhaler as needed  Plan Patient Instructions  Set up for 2 D echo  Continue on Xarelto 20mg  daily Avoid NSAIDs,(No Advil, motrin , aleve, ibuprofen , etc) .  Refer to Hematology.  Alternate ice and heat to low back  Tylenol As needed  pain .  If back and leg pain not improving call back -will refer to ortho   Continue on Protonix 40mg  daily  Continue on Pepcid 20mg  At bedtime   Albuterol inhaler As needed   Zyrtec 10mg  daily As needed  drainage.  Follow up with in 6-8 weeks with Dr. Melvyn Novas  with PFT  Please contact office for sooner follow up if symptoms do not improve or worsen or seek emergency care       Back pain Right-sided sciatica pain.-Advised to use ice and heat.  Tylenol as needed.  If symptoms do not improve will need referral to orthopedics.  Avoid nonsteroidals.    I spent  40  minutes dedicated to the care of this patient on the date of this encounter to include pre-visit review of records, face-to-face time with the patient discussing conditions above, post visit ordering of testing, clinical documentation with the electronic health record, making appropriate referrals as documented, and communicating necessary findings to members of the patients care team.   Rexene Edison, NP 09/18/2022

## 2022-10-04 ENCOUNTER — Ambulatory Visit (HOSPITAL_COMMUNITY)
Admission: RE | Admit: 2022-10-04 | Discharge: 2022-10-04 | Disposition: A | Discharge status: Home / Self Care | Payer: Commercial Managed Care - PPO | Source: Ambulatory Visit | Attending: Adult Health | Admitting: Adult Health

## 2022-10-04 DIAGNOSIS — R5383 Other fatigue: Secondary | ICD-10-CM | POA: Diagnosis not present

## 2022-10-04 DIAGNOSIS — I2699 Other pulmonary embolism without acute cor pulmonale: Secondary | ICD-10-CM

## 2022-10-04 DIAGNOSIS — R0600 Dyspnea, unspecified: Secondary | ICD-10-CM | POA: Diagnosis not present

## 2022-10-04 LAB — ECHOCARDIOGRAM COMPLETE
AR max vel: 1.79 cm2
AV Area VTI: 1.88 cm2
AV Area mean vel: 1.77 cm2
AV Mean grad: 3 mmHg
AV Peak grad: 4.8 mmHg
Ao pk vel: 1.09 m/s
Area-P 1/2: 4.71 cm2
Calc EF: 57.8 %
MV M vel: 4.34 m/s
MV Peak grad: 75.3 mmHg
S' Lateral: 2.6 cm
Single Plane A2C EF: 58.9 %
Single Plane A4C EF: 55.6 %

## 2022-10-04 NOTE — Progress Notes (Signed)
  Echocardiogram 2D Echocardiogram has been performed.  Christy [name] Wynn Banker 10/04/2022, 11:30 AM

## 2022-10-09 ENCOUNTER — Inpatient Hospital Stay: Payer: Commercial Managed Care - PPO

## 2022-10-09 ENCOUNTER — Encounter: Payer: Self-pay | Admitting: Hematology and Oncology

## 2022-10-09 ENCOUNTER — Other Ambulatory Visit: Payer: Self-pay

## 2022-10-09 ENCOUNTER — Inpatient Hospital Stay: Payer: Commercial Managed Care - PPO | Attending: Hematology and Oncology | Admitting: Hematology and Oncology

## 2022-10-09 VITALS — BP 137/74 | HR 84 | Temp 98.7°F | Resp 18 | Ht 61.0 in | Wt 172.4 lb

## 2022-10-09 DIAGNOSIS — Z7901 Long term (current) use of anticoagulants: Secondary | ICD-10-CM | POA: Insufficient documentation

## 2022-10-09 DIAGNOSIS — I824Z1 Acute embolism and thrombosis of unspecified deep veins of right distal lower extremity: Secondary | ICD-10-CM | POA: Insufficient documentation

## 2022-10-09 DIAGNOSIS — Z7722 Contact with and (suspected) exposure to environmental tobacco smoke (acute) (chronic): Secondary | ICD-10-CM | POA: Insufficient documentation

## 2022-10-09 DIAGNOSIS — I2699 Other pulmonary embolism without acute cor pulmonale: Secondary | ICD-10-CM | POA: Insufficient documentation

## 2022-10-09 DIAGNOSIS — Z8249 Family history of ischemic heart disease and other diseases of the circulatory system: Secondary | ICD-10-CM | POA: Insufficient documentation

## 2022-10-09 NOTE — Progress Notes (Signed)
[facility] CONSULT NOTE  Patient Care Team: [name], NP as PCP - General (Adult Health Nurse Practitioner)   ASSESSMENT & PLAN:  Acute pulmonary embolism without acute cor pulmonale (HCC) She tolerated relatively well I suspect her DVT was provoked by reduced mobility and dehydration She is not keeping up-to-date with her screening mammogram or Pap smear although she has no signs or symptoms to suggest new cancer diagnosis She has strong family history of DVT; we cannot order a thrombophilia panel right now as it would not change management and Xarelto will interfere with interpretation of test results For now, recommend she continues taking Xarelto  I recommend repeat blood work and ultrasound in September for assessment  Acute deep vein thrombosis (DVT) of distal end of right lower extremity Her exam is benign I plan to repeat imaging study in September  Orders Placed This Encounter  Procedures   CBC with Differential (Cancer Center Only)    Standing Status:   Future    Standing Expiration Date:   10/09/2023   Basic Metabolic Panel - Cancer Center Only    Standing Status:   Future    Standing Expiration Date:   10/09/2023   D-dimer, quantitative    Standing Status:   Future    Standing Expiration Date:   10/09/2023    All questions were answered. The patient knows to call the clinic with any problems, questions or concerns. The total time spent in the appointment was 60 minutes encounter with patients including review of chart and various tests results, discussions about plan of care and coordination of care plan  Artis Delay, MD 4/22/202411:59 AM  CHIEF COMPLAINTS/PURPOSE OF CONSULTATION:  Right lower extremity DVT, PE, for further management  HISTORY OF PRESENTING ILLNESS:  Christy [name] 62 y.o. female is here because of recent diagnosis of DVT and PE The patient states that she has not been feeling well since March 2023 The year before that,  around March 2022, she fell down her stairs.  She was not evaluated formally for injury after the fall She started to notice some significant shortness of breath and reduced stamina on exertion.  She also have some intermittent nonproductive cough She follows with pulmonologist for lung nodule for some time She noted some increased sedentary lifestyle due to excessive fatigue over the past year At baseline, she does not drink water Her only oral fluid intake is in the form of coffee which she drink 2 to 3 cups/day.  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  Her brother had prostate cancer. In terms of cancer screening, she has not had mammogram for a while.  Her last Pap smear was more than 5 years ago She had colonoscopy screening in 2022  Due to her worsening shortness of breath, she was originally evaluated at urgent care. Subsequently, she was evaluated again by her pulmonologist who ordered a CT imaging that came back abnormal.  She also have venous Doppler ultrasound that confirmed right lower extremity DVT All the changes noted on her imaging were acute but the patient felt that her symptoms has begun more than a year ago She also have noticed  some intermittent tingling and cramping sensation in her fingers and toes.  They improve when she started taking vitamin B12 supplement  She denies lower extremity swelling, warmth, tenderness & erythema.  She denies recent chest pain on exertion, pre-syncopal episodes, hemoptysis, or palpitation. She denies recent history of trauma, long distance travel or recent surgery She had no prior history or diagnosis of cancer.  She had prior  surgeries before and never had perioperative thromboembolic events. The patient had never been exposed to birth control pills or hormone replacement therapy  The patient had been pregnant before and denies history of peripartum thromboembolic event or history of recurrent miscarriages.  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   TUBAL LIGATION      SOCIAL HISTORY: Social History   Socioeconomic History   Marital status: Married    Spouse name: Not on file   Number of children: 4   Years of education: Not on file   Highest education level: Not on file  Occupational History   Not on file  Tobacco Use   Smoking status: Never   Smokeless tobacco: Never  Vaping Use   Vaping Use: Never used  Substance and Sexual Activity   Alcohol use: Yes    Comment: wine and beer occ   Drug use: No   Sexual activity: Yes    Birth control/protection: Post-menopausal    Comment: intercourse age 21, sexual partners more than 5  Other Topics Concern   Not on file  Social History Narrative   Not on file   Social Determinants of Health   Financial Resource Strain: Not on file  Food Insecurity: No Food Insecurity (10/09/2022)   Hunger Vital Sign    Worried About Running Out of Food in the Last Year: Never true    Ran Out of Food in the Last Year: Never true  Transportation Needs: No Transportation Needs (10/09/2022)   PRAPARE - Administrator, Civil Service (Medical): No    Lack of Transportation (Non-Medical): No  Physical Activity: Not on file  Stress: Not on file  Social Connections: Not on file  Intimate Partner Violence: Not At Risk (10/09/2022)   Humiliation, Afraid, Rape,  and Kick questionnaire    Fear of Current or Ex-Partner: No    Emotionally Abused: No    Physically Abused: No    Sexually Abused: No    FAMILY HISTORY: Family History  Problem Relation Age of Onset   Clotting disorder Mother    Hypertension Mother    Heart disease Mother    Stroke Mother    Cancer Father    Prostate cancer Father    Hypertension Father    Prostate cancer Brother    Lupus Daughter    Colon cancer Neg Hx    Colon polyps Neg Hx    Esophageal cancer Neg Hx    Rectal cancer Neg Hx    Stomach cancer Neg Hx     ALLERGIES:  is allergic to penicillins.  MEDICATIONS:  Current Outpatient Medications  Medication Sig Dispense Refill   albuterol (VENTOLIN HFA) 108 (90 Base) MCG/ACT inhaler Inhale 2 puffs into  the lungs every 4 (four) hours as needed for wheezing or shortness of breath.     cetirizine (ZYRTEC) 10 MG tablet Take 10 mg by mouth daily.     famotidine (PEPCID) 20 MG tablet One after supper 30 tablet 11   pantoprazole (PROTONIX) 40 MG tablet Take 1 tablet (40 mg total) by mouth daily. Take 30-60 min before first meal of the day 30 tablet 2   rivaroxaban (XARELTO) 20 MG TABS tablet Take 1 tablet (20 mg total) by mouth daily with supper. 30 tablet 3   VITAMIN D3 10 MCG (400 UNIT) tablet Take 800 Units by mouth daily.     No current facility-administered medications for this visit.    REVIEW OF SYSTEMS:   Constitutional: Denies fevers, chills or abnormal night sweats Eyes: Denies blurriness of vision, double vision or watery eyes Ears, nose, mouth, throat, and face: Denies mucositis or sore throat Respiratory: Denies cough, dyspnea or wheezes Cardiovascular: Denies palpitation, chest discomfort or lower extremity swelling Gastrointestinal:  Denies nausea, heartburn or change in bowel habits Skin: Denies abnormal skin rashes Lymphatics: Denies new lymphadenopathy or easy bruising Neurological:Denies numbness, tingling or new weaknesses Behavioral/Psych:  Mood is stable, no new changes  All other systems were reviewed with the patient and are negative.  PHYSICAL EXAMINATION: ECOG PERFORMANCE STATUS: 1 - Symptomatic but completely ambulatory  Vitals:   10/09/22 1100  BP: 137/74  Pulse: 84  Resp: 18  Temp: 98.7 F (37.1 C)  SpO2: 99%   Filed Weights   10/09/22 1100  Weight: 172 lb 6.4 oz (78.2 kg)    GENERAL:alert, no distress and comfortable SKIN: skin color, texture, turgor are normal, no rashes or significant lesions EYES: normal, conjunctiva are pink and non-injected, sclera clear OROPHARYNX:no exudate, no erythema and lips, buccal mucosa, and tongue normal  NECK: supple, thyroid normal size, non-tender, without nodularity LYMPH:  no palpable lymphadenopathy in the cervical, axillary or inguinal LUNGS: She has diffuse expiratory wheezes  HEART: regular rate & rhythm and no murmurs and no lower extremity edema ABDOMEN:abdomen soft, non-tender and normal bowel sounds Musculoskeletal:no cyanosis of digits and no clubbing  PSYCH: alert & oriented x 3 with fluent speech NEURO: no focal motor/sensory deficits  LABORATORY DATA:  I have reviewed the data as listed Lab Results  Component Value Date   WBC 7.5 08/22/2022   HGB 11.3 (L) 08/22/2022   HCT 35.9 (L) 08/22/2022   MCV 77.9 (L) 08/22/2022   PLT 374 08/22/2022   RADIOGRAPHIC STUDIES: I have personally reviewed her CT imaging I have personally reviewed the radiological images as listed and agreed with the findings in the report. ECHOCARDIOGRAM COMPLETE  Result Date: 10/04/2022    ECHOCARDIOGRAM REPORT   Patient Name:   Christy [name] Date of Exam: 10/04/2022 Medical Rec #:  [id number]      Height:       61.0 in Accession #:    [id number]     Weight:       171.6 lb Date of Birth:  24-Jan-1961       BSA:          1.770 m Patient Age:    61 years       BP:           126/70 mmHg Patient Gender: F              HR:           73 bpm. Exam Location:  Inpatient Procedure: Cardiac  Doppler, Color Doppler, 2D Echo and Strain Analysis Indications:    Chemo  History:        Patient has no prior history of Echocardiogram examinations.                 Signs/Symptoms:Dyspnea and Fatigue.  Sonographer:    Lucy Antigua Referring Phys: [phone] TAMMY S PARRETT IMPRESSIONS  1. Left ventricular ejection fraction, by estimation, is 60 to 65%. The left ventricle has normal function. The left ventricle has no regional wall motion abnormalities. Left ventricular diastolic parameters are consistent with Grade I diastolic dysfunction (impaired relaxation). The average left ventricular global longitudinal strain is -17.6 %. Making abstraction of the poor tracking of the inferolateral wall in the apical 3 strain rate image, the estimated global longitudinal strain is in the  -19-20% range.  2. Right ventricular systolic function is normal. The right ventricular size is normal. There is normal pulmonary artery systolic pressure. The estimated right ventricular systolic pressure is 25.8 mmHg.  3. The mitral valve is normal in structure. Trivial mitral valve regurgitation. No evidence of mitral stenosis.  4. The aortic valve is tricuspid. Aortic valve regurgitation is not visualized. No aortic stenosis is present.  5. The inferior vena cava is normal in size with greater than 50% respiratory variability, suggesting right atrial pressure of 3 mmHg. FINDINGS  Left Ventricle: There is poor myocardial tracking in the apical 3 strain rate image. Left ventricular ejection fraction, by estimation, is 60 to 65%. The left ventricle has normal function. The left ventricle has no regional wall motion abnormalities. The average left ventricular global longitudinal strain is -17.6 %. The global longitudinal strain is normal despite suboptimal segment tracking. The left ventricular internal cavity size was normal in size. There is no left ventricular hypertrophy. Left  ventricular diastolic parameters are consistent with Grade I  diastolic dysfunction (impaired relaxation). Right Ventricle: The right ventricular size is normal. No increase in right ventricular wall thickness. Right ventricular systolic function is normal. There is normal pulmonary artery systolic pressure. The tricuspid regurgitant velocity is 2.39 m/s, and  with an assumed right atrial pressure of 3 mmHg, the estimated right ventricular systolic pressure is 25.8 mmHg. Left Atrium: Left atrial size was normal in size. Right Atrium: Right atrial size was normal in size. Pericardium: There is no evidence of pericardial effusion. Mitral Valve: The mitral valve is normal in structure. Trivial mitral valve regurgitation. No evidence of mitral valve stenosis. Tricuspid Valve: The tricuspid valve is normal in structure. Tricuspid valve regurgitation is mild . No evidence of tricuspid stenosis. Aortic Valve: The aortic valve is tricuspid. Aortic valve regurgitation is not visualized. No aortic stenosis is present. Aortic valve mean gradient measures 3.0 mmHg. Aortic valve peak gradient measures 4.8 mmHg. Aortic valve area, by VTI measures 1.88 cm. Pulmonic Valve: The pulmonic valve was normal in structure. Pulmonic valve regurgitation is not visualized. No evidence of pulmonic stenosis. Aorta: The aortic root is normal in size and structure. Venous: The inferior vena cava is normal in size with greater than 50% respiratory variability, suggesting right atrial pressure of 3 mmHg. IAS/Shunts: No atrial level shunt detected by color flow Doppler.  LEFT VENTRICLE PLAX 2D LVIDd:         4.10 cm     Diastology LVIDs:         2.60 cm     LV e' medial:    5.55 cm/s LV PW:         0.80 cm  LV E/e' medial:  12.4 LV IVS:        0.80 cm     LV e' lateral:   7.72 cm/s LVOT diam:     1.60 cm     LV E/e' lateral: 8.9 LV SV:         41 LV SV Index:   23          2D Longitudinal Strain LVOT Area:     2.01 cm    2D Strain GLS (A2C):   -19.8 %                            2D Strain GLS (A3C):    -14.0 %                            2D Strain GLS (A4C):   -19.1 % LV Volumes (MOD)           2D Strain GLS Avg:     -17.6 % LV vol d, MOD A2C: 50.8 ml LV vol d, MOD A4C: 58.1 ml LV vol s, MOD A2C: 20.9 ml LV vol s, MOD A4C: 25.8 ml LV SV MOD A2C:     29.9 ml LV SV MOD A4C:     58.1 ml LV SV MOD BP:      31.6 ml RIGHT VENTRICLE RV S prime:     10.80 cm/s TAPSE (M-mode): 2.0 cm LEFT ATRIUM             Index        RIGHT ATRIUM          Index LA Vol (A2C):   36.4 ml 20.57 ml/m  RA Area:     9.27 cm LA Vol (A4C):   38.1 ml 21.53 ml/m  RA Volume:   17.10 ml 9.66 ml/m LA Biplane Vol: 37.8 ml 21.36 ml/m  AORTIC VALVE AV Area (Vmax):    1.79 cm AV Area (Vmean):   1.77 cm AV Area (VTI):     1.88 cm AV Vmax:           109.00 cm/s AV Vmean:          76.600 cm/s AV VTI:            0.220 m AV Peak Grad:      4.8 mmHg AV Mean Grad:      3.0 mmHg LVOT Vmax:         97.30 cm/s LVOT Vmean:        67.300 cm/s LVOT VTI:          0.206 m LVOT/AV VTI ratio: 0.94  AORTA Ao Root diam: 3.00 cm MITRAL VALVE               TRICUSPID VALVE MV Area (PHT): 4.71 cm    TR Peak grad:   22.8 mmHg MV Decel Time: 161 msec    TR Vmax:        239.00 cm/s MR Peak grad: 75.3 mmHg MR Vmax:      434.00 cm/s  SHUNTS MV E velocity: 68.60 cm/s  Systemic VTI:  0.21 m MV A velocity: 92.10 cm/s  Systemic Diam: 1.60 cm MV E/A ratio:  0.74 Mihai Croitoru MD Electronically signed by Thurmon Fair MD Signature Date/Time: 10/04/2022/1:56:33 PM    Final

## 2022-10-09 NOTE — Assessment & Plan Note (Signed)
Her exam is benign I plan to repeat imaging study in September

## 2022-10-09 NOTE — Assessment & Plan Note (Addendum)
She tolerated relatively well I suspect her DVT was provoked by reduced mobility and dehydration She is not keeping up-to-date with her screening mammogram or Pap smear although she has no signs or symptoms to suggest new cancer diagnosis She has strong family history of DVT; we cannot order a thrombophilia panel right now as it would not change management and Xarelto will interfere with interpretation of test results For now, recommend she continues taking Xarelto  I recommend repeat blood work and ultrasound in September for assessment

## 2022-10-10 NOTE — Progress Notes (Signed)
ATC x1.  LVM to return call. 

## 2022-10-12 NOTE — Progress Notes (Signed)
Called and spoke with patient, advised of results/recommendations per [name] NP.  She verbalized understanding.  She has a f/u on 5/16 at 3p for Pft with OV at 4pm, advised to arrive by 2:45 pm for check in.  She verbalized understanding.  Nothing further needed.

## 2022-10-18 ENCOUNTER — Telehealth: Payer: Self-pay | Admitting: Adult Health

## 2022-10-18 MED ORDER — RIVAROXABAN 20 MG PO TABS: 20.0000 mg | ORAL_TABLET | Freq: Every day | ORAL | 11 refills | Status: DC

## 2022-10-18 NOTE — Telephone Encounter (Signed)
Patient called to request a script for the blood thinner Xerelto, which she was given in the hospital and she said Tammy approved. It.  Please advise and call patient to discuss further.  CB# [phone]

## 2022-10-18 NOTE — Telephone Encounter (Signed)
Called and spoke with pt about her xarelto. Pt said at local pharmacy, it is going to be too expensive and she can get this cheaper at [facility] and needs to have the Rx sent to her so she can get it sent to the pharmacy. Pt has requested to have this faxed to her at [phone].  Fax has been sent. Nothing further needed.

## 2022-11-02 ENCOUNTER — Encounter: Payer: Self-pay | Admitting: Adult Health

## 2022-11-02 ENCOUNTER — Ambulatory Visit: Payer: Commercial Managed Care - PPO | Admitting: Internal Medicine

## 2022-11-02 ENCOUNTER — Ambulatory Visit (INDEPENDENT_AMBULATORY_CARE_PROVIDER_SITE_OTHER): Payer: Commercial Managed Care - PPO | Admitting: Adult Health

## 2022-11-02 ENCOUNTER — Ambulatory Visit (INDEPENDENT_AMBULATORY_CARE_PROVIDER_SITE_OTHER): Payer: Commercial Managed Care - PPO | Admitting: Internal Medicine

## 2022-11-02 VITALS — BP 130/68 | HR 96 | Temp 98.1°F | Ht 61.0 in | Wt 176.0 lb

## 2022-11-02 DIAGNOSIS — I2699 Other pulmonary embolism without acute cor pulmonale: Secondary | ICD-10-CM

## 2022-11-02 DIAGNOSIS — Z139 Encounter for screening, unspecified: Secondary | ICD-10-CM | POA: Diagnosis not present

## 2022-11-02 DIAGNOSIS — J4540 Moderate persistent asthma, uncomplicated: Secondary | ICD-10-CM | POA: Diagnosis not present

## 2022-11-02 DIAGNOSIS — J45909 Unspecified asthma, uncomplicated: Secondary | ICD-10-CM | POA: Insufficient documentation

## 2022-11-02 DIAGNOSIS — R0609 Other forms of dyspnea: Secondary | ICD-10-CM

## 2022-11-02 LAB — PULMONARY FUNCTION TEST
DL/VA % pred: 115 %
DL/VA: 4.97 ml/min/mmHg/L
DLCO cor % pred: 106 %
DLCO cor: 19.13 ml/min/mmHg
DLCO unc % pred: 106 %
DLCO unc: 19.13 ml/min/mmHg
FEF 25-75 Post: 1.97 L/sec
FEF 25-75 Pre: 0.92 L/sec
FEF2575-%Change-Post: 113 %
FEF2575-%Pred-Post: 96 %
FEF2575-%Pred-Pre: 44 %
FEV1-%Change-Post: 26 %
FEV1-%Pred-Post: 82 %
FEV1-%Pred-Pre: 65 %
FEV1-Post: 1.81 L
FEV1-Pre: 1.43 L
FEV1FVC-%Change-Post: 6 %
FEV1FVC-%Pred-Pre: 90 %
FEV6-%Change-Post: 19 %
FEV6-%Pred-Post: 88 %
FEV6-%Pred-Pre: 74 %
FEV6-Post: 2.43 L
FEV6-Pre: 2.04 L
FEV6FVC-%Pred-Post: 104 %
FEV6FVC-%Pred-Pre: 104 %
FVC-%Change-Post: 19 %
FVC-%Pred-Post: 85 %
FVC-%Pred-Pre: 71 %
FVC-Post: 2.43 L
FVC-Pre: 2.04 L
Post FEV1/FVC ratio: 74 %
Post FEV6/FVC ratio: 100 %
Pre FEV1/FVC ratio: 70 %
Pre FEV6/FVC Ratio: 100 %
RV % pred: 140 %
RV: 2.67 L
TLC % pred: 96 %
TLC: 4.42 L

## 2022-11-02 MED ORDER — TRELEGY ELLIPTA 200-62.5-25 MCG/ACT IN AEPB: 1.0000 | INHALATION_SPRAY | Freq: Every day | RESPIRATORY_TRACT | 0 refills | Status: DC

## 2022-11-02 MED ORDER — SPACER/AERO-HOLD CHAMBER BAGS MISC: 0 refills | Status: AC

## 2022-11-02 NOTE — Progress Notes (Signed)
Full PFT performed today. °

## 2022-11-02 NOTE — Progress Notes (Signed)
@Patient  ID: Christy [name], female    DOB: 1960/11/25, 62 y.o.   MRN: [id number]  Chief Complaint  Patient presents with   Follow-up    Referring provider: [name], NP  HPI: 62 year old female never smoker seen for pulmonary consult February 2024 for shortness of breath found to have PE and asthma Medical history significant for stable pulmonary nodule on serial CT consistent with a benign etiology  TEST/EVENTS :  CT chest was ordered last visit and completed on August 21, 2022. This showed a small acute nonocclusive subsegmental pulmonary embolism. In the right lower lobe. An unchanged mixed solid and subsolid nodule in the left upper lobe dating back to 2018 consistent with a benign etiology.   Venous Doppler showed a right lower extremity DVT.   2D echo October 04, 2022 showed EF at 60 to 65%, grade 1 diastolic dysfunction, normal pulmonary artery systolic pressure  FH : Positive for VTE and Lupus (2 kids and grandaughter)   11/02/2022 Follow up ; PE and Dyspnea  Patient returns for 6-week follow-up.  Patient was seen in February chronic shortness of breath.  She was set up for CT chest that was completed on August 21, 2022 that showed a small acute nonocclusive subsegmental PE.  Venous Doppler showed a right lower extremity DVT.  Patient was started on Xarelto.  Patient says she has been doing okay on Xarelto.  Did have a little over a week where she did not take it due to significant expense from her insurance.  Patient has a high deductible plan and insurance was charging her over $4000 for prescription of Xarelto.  Patient did obtain a prescription through [place] and is able to get Xarelto for $900 for 46-month prescription.  She has restarted on Xarelto.  She denies any hemoptysis, chest pain, calf pain or swelling.  Patient does have a strong family history of blood clots.  She was referred to hematology.  Patient has been recommended to continue on Xarelto.  And has a venous  Doppler scheduled for September 2024.  With hematology follow-up Patient also has been having some intermittent wheezing, shortness of breath and cough.  Thought to have a possible component of asthma and was given albuterol inhaler.  Patient says she has been having some trouble using her albuterol inhaler says it is confusing.  We discussed adding a spacer.  She was set up for pulmonary function testing that shows moderate airflow obstruction and restriction.  And significant reversibility.  Prebronchodilator FEV1 was 65%, ratio 70, FVC 71%, postbronchodilator FEV1 82%, ratio 74, FVC 85%, 26% bronchodilator change, diffusing capacity 106%.  We discussed her PFTs in detail.  Went over this is consistent most likely with asthma with patient education on asthma and asthma action plan. Patient has no history of cancer.  Has not had her routine screenings such as mammogram or GYN.  Also has not seen her primary care provider in a good while.      Allergies  Allergen Reactions   Penicillins Hives    Immunization History  Administered Date(s) Administered   Influenza-Unspecified 03/11/2020   PFIZER(Purple Top)SARS-COV-2 Vaccination 09/20/2019, 10/14/2019, 05/22/2020    Past Medical History:  Diagnosis Date   Allergy    Migraine aura without headache 1998    Tobacco History: Social History   Tobacco Use  Smoking Status Never  Smokeless Tobacco Never   Counseling given: Not Answered   Outpatient Medications Prior to Visit  Medication Sig Dispense Refill   albuterol (VENTOLIN  HFA) 108 (90 Base) MCG/ACT inhaler Inhale 2 puffs into the lungs every 4 (four) hours as needed for wheezing or shortness of breath.     cetirizine (ZYRTEC) 10 MG tablet Take 10 mg by mouth daily.     famotidine (PEPCID) 20 MG tablet One after supper 30 tablet 11   pantoprazole (PROTONIX) 40 MG tablet Take 1 tablet (40 mg total) by mouth daily. Take 30-60 min before first meal of the day 30 tablet 2    rivaroxaban (XARELTO) 20 MG TABS tablet Take 1 tablet (20 mg total) by mouth daily with supper. 30 tablet 11   VITAMIN D3 10 MCG (400 UNIT) tablet Take 800 Units by mouth daily.     No facility-administered medications prior to visit.     Review of Systems:   Constitutional:   No  weight loss, night sweats,  Fevers, chills, fatigue, or  lassitude.  HEENT:   No headaches,  Difficulty swallowing,  Tooth/dental problems, or  Sore throat,                No sneezing, itching, ear ache,  +nasal congestion, post nasal drip,   CV:  No chest pain,  Orthopnea, PND, swelling in lower extremities, anasarca, dizziness, palpitations, syncope.   GI  No heartburn, indigestion, abdominal pain, nausea, vomiting, diarrhea, change in bowel habits, loss of appetite, bloody stools.   Resp:   No chest wall deformity  Skin: no rash or lesions.  GU: no dysuria, change in color of urine, no urgency or frequency.  No flank pain, no hematuria   MS:  No joint pain or swelling.  No decreased range of motion.  No back pain.    Physical Exam  BP 130/68 (BP Location: Left Arm, Patient Position: Sitting, Cuff Size: Normal)   Pulse 96   Temp 98.1 F (36.7 C) (Oral)   Ht 5\' 1"  (1.549 m)   Wt 176 lb (79.8 kg)   LMP 05/10/2011   SpO2 100%   BMI 33.25 kg/m   GEN: A/Ox3; pleasant , NAD, well nourished    HEENT:  [place]/AT,  EACs-clear, TMs-wnl, NOSE-clear, THROAT-clear, no lesions, no postnasal drip or exudate noted.   NECK:  Supple w/ fair ROM; no JVD; normal carotid impulses w/o bruits; no thyromegaly or nodules palpated; no lymphadenopathy.    RESP few expiratory wheezes.  Speaks in full sentences no accessory muscle use, no dullness to percussion  CARD:  RRR, no m/r/g, no peripheral edema, pulses intact, no cyanosis or clubbing.  GI:   Soft & nt; nml bowel sounds; no organomegaly or masses detected.   Musco: Warm bil, no deformities or joint swelling noted.   Neuro: alert, no focal deficits noted.     Skin: Warm, no lesions or rashes    Lab Results:   BMET   BNP No results found for: "BNP"  ProBNP No results found for: "PROBNP"  Imaging: ECHOCARDIOGRAM COMPLETE  Result Date: 10/04/2022    ECHOCARDIOGRAM REPORT   Patient Name:   Christy [name] Date of Exam: 10/04/2022 Medical Rec #:  [id number]      Height:       61.0 in Accession #:    [id number]     Weight:       171.6 lb Date of Birth:  [birth year]       BSA:          1.770 m Patient Age:    61 years       BP:  126/70 mmHg Patient Gender: F              HR:           73 bpm. Exam Location:  Inpatient Procedure: Cardiac Doppler, Color Doppler, 2D Echo and Strain Analysis Indications:    Chemo  History:        Patient has no prior history of Echocardiogram examinations.                 Signs/Symptoms:Dyspnea and Fatigue.  Sonographer:    Lucy Antigua Referring Phys: [phone] Christy [name] IMPRESSIONS  1. Left ventricular ejection fraction, by estimation, is 60 to 65%. The left ventricle has normal function. The left ventricle has no regional wall motion abnormalities. Left ventricular diastolic parameters are consistent with Grade I diastolic dysfunction (impaired relaxation). The average left ventricular global longitudinal strain is -17.6 %. Making abstraction of the poor tracking of the inferolateral wall in the apical 3 strain rate image, the estimated global longitudinal strain is in the  -19-20% range.  2. Right ventricular systolic function is normal. The right ventricular size is normal. There is normal pulmonary artery systolic pressure. The estimated right ventricular systolic pressure is 25.8 mmHg.  3. The mitral valve is normal in structure. Trivial mitral valve regurgitation. No evidence of mitral stenosis.  4. The aortic valve is tricuspid. Aortic valve regurgitation is not visualized. No aortic stenosis is present.  5. The inferior vena cava is normal in size with greater than 50% respiratory variability, suggesting right  atrial pressure of 3 mmHg. FINDINGS  Left Ventricle: There is poor myocardial tracking in the apical 3 strain rate image. Left ventricular ejection fraction, by estimation, is 60 to 65%. The left ventricle has normal function. The left ventricle has no regional wall motion abnormalities. The average left ventricular global longitudinal strain is -17.6 %. The global longitudinal strain is normal despite suboptimal segment tracking. The left ventricular internal cavity size was normal in size. There is no left ventricular hypertrophy. Left  ventricular diastolic parameters are consistent with Grade I diastolic dysfunction (impaired relaxation). Right Ventricle: The right ventricular size is normal. No increase in right ventricular wall thickness. Right ventricular systolic function is normal. There is normal pulmonary artery systolic pressure. The tricuspid regurgitant velocity is 2.39 m/s, and  with an assumed right atrial pressure of 3 mmHg, the estimated right ventricular systolic pressure is 25.8 mmHg. Left Atrium: Left atrial size was normal in size. Right Atrium: Right atrial size was normal in size. Pericardium: There is no evidence of pericardial effusion. Mitral Valve: The mitral valve is normal in structure. Trivial mitral valve regurgitation. No evidence of mitral valve stenosis. Tricuspid Valve: The tricuspid valve is normal in structure. Tricuspid valve regurgitation is mild . No evidence of tricuspid stenosis. Aortic Valve: The aortic valve is tricuspid. Aortic valve regurgitation is not visualized. No aortic stenosis is present. Aortic valve mean gradient measures 3.0 mmHg. Aortic valve peak gradient measures 4.8 mmHg. Aortic valve area, by VTI measures 1.88 cm. Pulmonic Valve: The pulmonic valve was normal in structure. Pulmonic valve regurgitation is not visualized. No evidence of pulmonic stenosis. Aorta: The aortic root is normal in size and structure. Venous: The inferior vena cava is normal in  size with greater than 50% respiratory variability, suggesting right atrial pressure of 3 mmHg. IAS/Shunts: No atrial level shunt detected by color flow Doppler.  LEFT VENTRICLE PLAX 2D LVIDd:         4.10 cm  Diastology LVIDs:         2.60 cm     LV e' medial:    5.55 cm/s LV PW:         0.80 cm     LV E/e' medial:  12.4 LV IVS:        0.80 cm     LV e' lateral:   7.72 cm/s LVOT diam:     1.60 cm     LV E/e' lateral: 8.9 LV SV:         41 LV SV Index:   23          2D Longitudinal Strain LVOT Area:     2.01 cm    2D Strain GLS (A2C):   -19.8 %                            2D Strain GLS (A3C):   -14.0 %                            2D Strain GLS (A4C):   -19.1 % LV Volumes (MOD)           2D Strain GLS Avg:     -17.6 % LV vol d, MOD A2C: 50.8 ml LV vol d, MOD A4C: 58.1 ml LV vol s, MOD A2C: 20.9 ml LV vol s, MOD A4C: 25.8 ml LV SV MOD A2C:     29.9 ml LV SV MOD A4C:     58.1 ml LV SV MOD BP:      31.6 ml RIGHT VENTRICLE RV S prime:     10.80 cm/s TAPSE (M-mode): 2.0 cm LEFT ATRIUM             Index        RIGHT ATRIUM          Index LA Vol (A2C):   36.4 ml 20.57 ml/m  RA Area:     9.27 cm LA Vol (A4C):   38.1 ml 21.53 ml/m  RA Volume:   17.10 ml 9.66 ml/m LA Biplane Vol: 37.8 ml 21.36 ml/m  AORTIC VALVE AV Area (Vmax):    1.79 cm AV Area (Vmean):   1.77 cm AV Area (VTI):     1.88 cm AV Vmax:           109.00 cm/s AV Vmean:          76.600 cm/s AV VTI:            0.220 m AV Peak Grad:      4.8 mmHg AV Mean Grad:      3.0 mmHg LVOT Vmax:         97.30 cm/s LVOT Vmean:        67.300 cm/s LVOT VTI:          0.206 m LVOT/AV VTI ratio: 0.94  AORTA Ao Root diam: 3.00 cm MITRAL VALVE               TRICUSPID VALVE MV Area (PHT): 4.71 cm    TR Peak grad:   22.8 mmHg MV Decel Time: 161 msec    TR Vmax:        239.00 cm/s MR Peak grad: 75.3 mmHg MR Vmax:      434.00 cm/s  SHUNTS MV E velocity: 68.60 cm/s  Systemic VTI:  0.21 m MV A velocity: 92.10 cm/s  Systemic Diam: 1.60 cm  MV E/A ratio:  0.74 Mihai Croitoru MD  Electronically signed by Thurmon Fair MD Signature Date/Time: 10/04/2022/1:56:33 PM    Final          Latest Ref Rng & Units 11/02/2022    2:41 PM  PFT Results  FVC-Pre L 2.04  P  FVC-Predicted Pre % 71  P  FVC-Post L 2.43  P  FVC-Predicted Post % 85  P  Pre FEV1/FVC % % 70  P  Post FEV1/FCV % % 74  P  FEV1-Pre L 1.43  P  FEV1-Predicted Pre % 65  P  FEV1-Post L 1.81  P  DLCO uncorrected ml/min/mmHg 19.13  P  DLCO UNC% % 106  P  DLCO corrected ml/min/mmHg 19.13  P  DLCO COR %Predicted % 106  P  DLVA Predicted % 115  P  TLC L 4.42  P  TLC % Predicted % 96  P  RV % Predicted % 140  P    P Preliminary result    No results found for: "NITRICOXIDE"      Assessment & Plan:   Pulmonary embolism (HCC) Diagnosed with acute PE and right DVT-most likely provoked from extensive travel and obesity/immobility.  Patient is continue on Xarelto.  Long discussion with her regarding compliance and taking on a consistent basis.  She assures me that she will not miss any further doses.  Is obtaining her prescription through [place] which is much more affordable.  2D echo showed no evidence of right heart strain or pulmonary hypertension. She has to follow-up with hematology as planned in September for follow-up venous Doppler.  Referral for routine screenings of mammogram and GYN  Plan  Patient Instructions  Continue on Xarelto 20mg  daily Avoid NSAIDs,(No Advil, motrin , aleve, ibuprofen , etc) .  Follow up with Hematology in September as planned  Refer routine screening Mammogram  Refer to GYN for PAP screening .   Begin Trelegy 1 puff daily until sample is gone, then Begin Advair 250 1 puff Twice daily , rinse after use.  May stop Protonix and Pepcid  Albuterol inhaler As needed  use with spacer .  Zyrtec 10mg  daily As needed  drainage.  Follow up with in 6 weeks with Dr. Sherene Sires  or Melvinia Ashby NP and As needed       Asthma Moderate persistent asthma.  PFTs show moderate  restriction/obstruction with significant reversibility.  Patient has intermittent cough and wheezing which are also consistent with asthma-will begin ICS/LABA inhaler.  Have asked patient to call our office back if not affordable through her insurance.  For now we will give her Trelegy inhaler sample until we can get her started on Advair which is generic. Asthma action plan discussed.  Albuterol as needed. Check Feno on return visit  Plan Patient Instructions  Continue on Xarelto 20mg  daily Avoid NSAIDs,(No Advil, motrin , aleve, ibuprofen , etc) .  Follow up with Hematology in September as planned  Refer routine screening Mammogram  Refer to GYN for PAP screening .   Begin Trelegy 1 puff daily until sample is gone, then Begin Advair 250 1 puff Twice daily , rinse after use.  May stop Protonix and Pepcid  Albuterol inhaler As needed  use with spacer .  Zyrtec 10mg  daily As needed  drainage.  Follow up with in 6 weeks with Dr. Sherene Sires  or Landy Mace NP and As needed         Christy Oaks, NP 11/02/2022

## 2022-11-02 NOTE — Patient Instructions (Addendum)
Continue on Xarelto 20mg  daily Avoid NSAIDs,(No Advil, motrin , aleve, ibuprofen , etc) .  Follow up with Hematology in September as planned  Refer routine screening Mammogram  Refer to GYN for PAP screening .   Begin Trelegy 1 puff daily until sample is gone, then Begin Advair 250 1 puff Twice daily , rinse after use.  May stop Protonix and Pepcid  Albuterol inhaler As needed  use with spacer .  Zyrtec 10mg  daily As needed  drainage.  Follow up with in 6 weeks with Dr. Sherene Sires  or Hoover Grewe NP and As needed

## 2022-11-02 NOTE — Assessment & Plan Note (Signed)
Diagnosed with acute PE and right DVT-most likely provoked from extensive travel and obesity/immobility.  Patient is continue on Xarelto.  Long discussion with her regarding compliance and taking on a consistent basis.  She assures me that she will not miss any further doses.  Is obtaining her prescription through [place] which is much more affordable.  2D echo showed no evidence of right heart strain or pulmonary hypertension. She has to follow-up with hematology as planned in September for follow-up venous Doppler.  Referral for routine screenings of mammogram and GYN  Plan  Patient Instructions  Continue on Xarelto 20mg  daily Avoid NSAIDs,(No Advil, motrin , aleve, ibuprofen , etc) .  Follow up with Hematology in September as planned  Refer routine screening Mammogram  Refer to GYN for PAP screening .   Begin Trelegy 1 puff daily until sample is gone, then Begin Advair 250 1 puff Twice daily , rinse after use.  May stop Protonix and Pepcid  Albuterol inhaler As needed  use with spacer .  Zyrtec 10mg  daily As needed  drainage.  Follow up with in 6 weeks with Dr. Sherene Sires  or Gregori Abril NP and As needed

## 2022-11-02 NOTE — Patient Instructions (Signed)
Full PFT performed today. °

## 2022-11-02 NOTE — Assessment & Plan Note (Signed)
Moderate persistent asthma.  PFTs show moderate restriction/obstruction with significant reversibility.  Patient has intermittent cough and wheezing which are also consistent with asthma-will begin ICS/LABA inhaler.  Have asked patient to call our office back if not affordable through her insurance.  For now we will give her Trelegy inhaler sample until we can get her started on Advair which is generic. Asthma action plan discussed.  Albuterol as needed. Check Feno on return visit  Plan Patient Instructions  Continue on Xarelto 20mg  daily Avoid NSAIDs,(No Advil, motrin , aleve, ibuprofen , etc) .  Follow up with Hematology in September as planned  Refer routine screening Mammogram  Refer to GYN for PAP screening .   Begin Trelegy 1 puff daily until sample is gone, then Begin Advair 250 1 puff Twice daily , rinse after use.  May stop Protonix and Pepcid  Albuterol inhaler As needed  use with spacer .  Zyrtec 10mg  daily As needed  drainage.  Follow up with in 6 weeks with Dr. Sherene Sires  or Keundra Petrucelli NP and As needed

## 2022-11-03 MED ORDER — FLUTICASONE-SALMETEROL 250-50 MCG/ACT IN AEPB: 1.0000 | INHALATION_SPRAY | Freq: Two times a day (BID) | RESPIRATORY_TRACT | 11 refills | Status: DC

## 2022-11-03 NOTE — Addendum Note (Signed)
Addended by: Delrae Rend on: 11/03/2022 09:01 AM   Modules accepted: Orders

## 2022-11-04 ENCOUNTER — Other Ambulatory Visit: Payer: Self-pay | Admitting: Internal Medicine

## 2022-11-04 DIAGNOSIS — R0609 Other forms of dyspnea: Secondary | ICD-10-CM

## 2022-11-06 ENCOUNTER — Encounter (HOSPITAL_COMMUNITY): Payer: Self-pay

## 2022-11-06 ENCOUNTER — Ambulatory Visit (HOSPITAL_COMMUNITY)
Admission: EM | Admit: 2022-11-06 | Discharge: 2022-11-06 | Disposition: A | Discharge status: Home / Self Care | Payer: Commercial Managed Care - PPO | Attending: Internal Medicine | Admitting: Internal Medicine

## 2022-11-06 DIAGNOSIS — H1131 Conjunctival hemorrhage, right eye: Secondary | ICD-10-CM | POA: Diagnosis not present

## 2022-11-06 HISTORY — DX: Other pulmonary embolism without acute cor pulmonale: I26.99

## 2022-11-06 HISTORY — DX: Acute embolism and thrombosis of unspecified deep veins of unspecified lower extremity: I82.409

## 2022-11-06 MED ORDER — TETRACAINE HCL 0.5 % OP SOLN
OPHTHALMIC | Status: AC
  Filled 2022-11-06: qty 4

## 2022-11-06 MED ORDER — FLUORESCEIN SODIUM 1 MG OP STRP
ORAL_STRIP | OPHTHALMIC | Status: AC
  Filled 2022-11-06: qty 1

## 2022-11-06 NOTE — ED Triage Notes (Signed)
Pt states on blood thinners d/t having a PE and DVT 2 months ago. States her rt eye has been blood shot since last Wednesday but has worsen and today having pain. Pt c/o pain and numbness to rt leg since this am.

## 2022-11-06 NOTE — ED Provider Notes (Signed)
[facility]    CSN: [id number] Arrival date & time: 11/06/22  1249      History   Chief Complaint Chief Complaint  Patient presents with   Eye Pain   Leg Pain    HPI Christy [name] is a 62 y.o. female on Xarelto comes to urgent care with eye pain.  Patient noticed some redness on the eye a couple of days ago.  When patient woke up this morning the symptoms had worsened.  She endorses some noncolored floaters in her visual field.  No double vision or blurry vision.  Patient denies rubbing her eyes or any itchy eyes.  No headaches.  No dizziness, near syncope or syncopal episode.   HPI  Past Medical History:  Diagnosis Date   Allergy    DVT (deep venous thrombosis) (HCC)    Migraine aura without headache 1998   PE (pulmonary thromboembolism) (HCC)     Patient Active Problem List   Diagnosis Date Noted   Asthma 11/02/2022   Acute deep vein thrombosis (DVT) of distal end of right lower extremity (HCC) 10/09/2022   Pulmonary embolism (HCC) 09/18/2022   Dyspnea 09/18/2022   Back pain 09/18/2022   Acute pulmonary embolism without acute cor pulmonale (HCC) 08/22/2022   Upper airway cough syndrome 08/09/2022   Dyspnea on exertion 08/24/2016   Solitary pulmonary nodule 08/23/2016   Acute bronchitis 05/23/2011   Migraine headache 01/26/2011   Fatigue 01/26/2011    Past Surgical History:  Procedure Laterality Date   COLONOSCOPY  15 years ago    in [place] normal exam per pt   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 Authorizing Provider  albuterol (VENTOLIN HFA) 108 (90 Base) MCG/ACT inhaler Inhale 2 puffs into the lungs every 4 (four) hours as  needed for wheezing or shortness of breath. 07/19/22   [provider]  cetirizine (ZYRTEC) 10 MG tablet Take 10 mg by mouth daily. 07/19/22   [provider]  famotidine (PEPCID) 20 MG tablet One after supper 08/08/22   Nyoka Cowden, MD  fluticasone-salmeterol (ADVAIR) 250-50 MCG/ACT AEPB Inhale 1 puff into the lungs in the morning and at bedtime. 11/03/22   Parrett, Virgel Bouquet, NP  Fluticasone-Umeclidin-Vilant (TRELEGY ELLIPTA) 200-62.5-25 MCG/ACT AEPB Inhale 1 puff into the lungs daily at 6 (six) AM. 11/02/22   Parrett, Tammy S, NP  pantoprazole (PROTONIX) 40 MG tablet Take 1 tablet (40 mg total) by mouth daily. Take 30-60 min before first meal of the day 08/08/22   Nyoka Cowden, MD  rivaroxaban (XARELTO) 20 MG TABS tablet Take 1 tablet (20 mg total) by mouth daily with supper. 10/18/22   Parrett, Virgel Bouquet, NP  Spacer/Aero-Hold Chamber Bags MISC Use spacer with albuterol and Advair inhalers. 11/02/22   Parrett, Virgel Bouquet, NP  VITAMIN D3 10 MCG (400 UNIT) tablet Take 800 Units by mouth daily.  08/08/22   [provider]    Family History Family History  Problem Relation Age of Onset   Clotting disorder Mother    Hypertension Mother    Heart disease Mother    Stroke Mother    Cancer Father    Prostate cancer Father    Hypertension Father    Prostate cancer Brother    Lupus Daughter    Colon cancer Neg Hx    Colon polyps Neg Hx    Esophageal cancer Neg Hx    Rectal cancer Neg Hx    Stomach cancer Neg Hx     Social History Social History   Tobacco Use   Smoking status: Never   Smokeless tobacco: Never  Vaping Use   Vaping Use: Never used  Substance Use Topics   Alcohol use: Yes    Comment: wine and beer occ   Drug use: No     Allergies   Penicillins   Review of Systems Review of Systems As per HPI  Physical Exam Triage Vital Signs ED Triage Vitals  Enc Vitals Group     BP 11/06/22 1444 132/87     Pulse Rate 11/06/22 1444 73     Resp 11/06/22  1444 18     Temp 11/06/22 1444 98 F (36.7 C)     Temp Source 11/06/22 1444 Oral     SpO2 11/06/22 1444 96 %     Weight --      Height --      Head Circumference --      Peak Flow --      Pain Score 11/06/22 1445 4     Pain Loc --      Pain Edu? --      Excl. in GC? --    No data found.  Updated Vital Signs BP 132/87 (BP Location: Right Arm)   Pulse 73   Temp 98 F (36.7 C) (Oral)   Resp 18   LMP 05/10/2011   SpO2 96%   Visual Acuity Right Eye Distance:   Left Eye Distance:   Bilateral Distance:    Right Eye Near:   Left Eye Near:    Bilateral Near:     Physical Exam Vitals and nursing note reviewed.  Constitutional:      General: She is not in acute distress.    Appearance: She is not ill-appearing.  HENT:     Nose: Nose normal.     Mouth/Throat:     Mouth: Mucous membranes are moist.  Eyes:     Comments: Subconjunctival hemorrhage of the right eye.  Cardiovascular:     Rate and Rhythm: Normal rate and regular rhythm.  Musculoskeletal:        General: Normal range of motion.  Neurological:     Mental Status: She is alert.      UC Treatments / Results  Labs (all labs ordered are listed, but only abnormal results are displayed) Labs Reviewed - No data to display  EKG   Radiology No results found.  Procedures Procedures (including critical care time)  Medications Ordered in UC Medications - No data to display  Initial Impression / Assessment and Plan / UC Course  I have reviewed the triage vital signs and the nursing notes.  Pertinent labs & imaging results that were available during my care of the patient were reviewed by me and considered in my medical decision making (see chart for details).     1.  Subconjunctival hemorrhage  of the right eye: Fluorescein stain is negative for corneal abrasion. Patient is advised to follow-up with eye specialist if she develops worsening pain or blurry or double vision. Return precautions  given. Final Clinical Impressions(s) / UC Diagnoses   Final diagnoses:  Subconjunctival hemorrhage of right eye     Discharge Instructions      Fluorescein stain is negative for corneal abrasion The redness of your eye is likely related to you being on blood thinners If you have worsening symptoms, blurry vision, double vision or worsening light sensitivity please follow-up with your eye doctor immediately to be reevaluated.   ED Prescriptions   None    PDMP not reviewed this encounter.   Merrilee Jansky, MD 11/06/22 [phone]

## 2022-11-06 NOTE — Discharge Instructions (Signed)
Fluorescein stain is negative for corneal abrasion The redness of your eye is likely related to you being on blood thinners If you have worsening symptoms, blurry vision, double vision or worsening light sensitivity please follow-up with your eye doctor immediately to be reevaluated.

## 2022-12-14 ENCOUNTER — Ambulatory Visit (INDEPENDENT_AMBULATORY_CARE_PROVIDER_SITE_OTHER): Payer: Commercial Managed Care - PPO | Admitting: Adult Health

## 2022-12-14 ENCOUNTER — Encounter: Payer: Self-pay | Admitting: Adult Health

## 2022-12-14 VITALS — BP 120/64 | HR 82 | Temp 97.9°F | Ht 61.0 in | Wt 178.8 lb

## 2022-12-14 DIAGNOSIS — I2699 Other pulmonary embolism without acute cor pulmonale: Secondary | ICD-10-CM

## 2022-12-14 DIAGNOSIS — J4530 Mild persistent asthma, uncomplicated: Secondary | ICD-10-CM | POA: Diagnosis not present

## 2022-12-14 MED ORDER — TRELEGY ELLIPTA 200-62.5-25 MCG/ACT IN AEPB: 1.0000 | INHALATION_SPRAY | Freq: Every day | RESPIRATORY_TRACT | 5 refills | Status: DC

## 2022-12-14 NOTE — Assessment & Plan Note (Signed)
PE and right DVT-most likely provoked from immobility and dehydration.  Patient is doing well on Xarelto.  She has been seen by hematology with plans for a venous Doppler repeat in September and follow-up visit.  She does have a family history of VTE.  Length of therapy per hematology recommendations Routine screenings of mammogram and GYN referral are pending Patient education on Xarelto given  Plan  Patient Instructions  Continue on Xarelto 20mg  daily Avoid NSAIDs,(No Advil, motrin , aleve, ibuprofen , etc) .  Follow up with Hematology in September as planned  Continue on Trelegy 1 puff daily  Albuterol inhaler As needed  use with spacer .  Zyrtec 10mg  daily As needed  drainage.  Follow up with in 3 months and As needed

## 2022-12-14 NOTE — Progress Notes (Signed)
@Patient  ID: Christy [name], female    DOB: October 12, 1960, 62 y.o.   MRN: [id number]  Chief Complaint  Patient presents with   Follow-up    Referring provider: [name], NP  HPI: 62 year old female never smoker seen for pulmonary consult February 2024 for shortness of breath found to have PE/DVT  and asthma Medical history significant for stable pulmonary nodule on serial CT consistent with a benign etiology  TEST/EVENTS :  CT chest was ordered last visit and completed on August 21, 2022. This showed a small acute nonocclusive subsegmental pulmonary embolism. In the right lower lobe. An unchanged mixed solid and subsolid nodule in the left upper lobe dating back to 2018 consistent with a benign etiology.    Venous Doppler showed a right lower extremity DVT.    2D echo October 04, 2022 showed EF at 60 to 65%, grade 1 diastolic dysfunction, normal pulmonary artery systolic pressure  pulmonary function testing that shows moderate airflow obstruction and restriction. And significant reversibility. Prebronchodilator FEV1 was 65%, ratio 70, FVC 71%, postbronchodilator FEV1 82%, ratio 74, FVC 85%, 26% bronchodilator change, diffusing capacity 106%.    FH : Positive for VTE and Lupus (2 kids and grandaughter)   12/14/2022 Follow up : PE,, Asthma  Patient presents for 6-week follow-up.  Patient has a history of PE.  CT chest August 21, 2022 showed a small acute nonocclusive subsegmental PE.  She was started on Xarelto.  Patient has been seen by hematology.  Felt PE and DVT was most likely provoked.  However has a family history.  She has a follow-up venous Doppler in September and office visit with hematology.  She says she is tolerating Xarelto well.  Endorses compliance.  She is getting her prescription from [place].  2D echo October 04, 2022 showed normal EF at 6065%, grade 1 diastolic dysfunction.  Normal pulmonary artery systolic pressure. She had not had any recent routine screenings such as  mammogram or gynecological screening.  She was referred to GYN and for mammogram which are pending.  She says they are scheduled in the next few weeks.  Last visit patient was started on Trelegy for her asthma.  She says that it has improved significantly.  Feels that Trelegy is working very well.  She is requesting a prescription.  Initially insurance was not going to cover and she was changed to Advair but says that she has changed her insurance and now Trelegy is covered.  She denies any increased albuterol use.  She denies any cough or wheezing.   Allergies  Allergen Reactions   Penicillins Hives    Immunization History  Administered Date(s) Administered   Influenza-Unspecified 03/11/2020, 03/28/2021   PFIZER(Purple Top)SARS-COV-2 Vaccination 09/20/2019, 10/14/2019, 05/22/2020    Past Medical History:  Diagnosis Date   Allergy    DVT (deep venous thrombosis) (HCC)    Migraine aura without headache 1998   PE (pulmonary thromboembolism) (HCC)     Tobacco History: Social History   Tobacco Use  Smoking Status Never  Smokeless Tobacco Never   Counseling given: Not Answered   Outpatient Medications Prior to Visit  Medication Sig Dispense Refill   albuterol (VENTOLIN HFA) 108 (90 Base) MCG/ACT inhaler Inhale 2 puffs into the lungs every 4 (four) hours as needed for wheezing or shortness of breath.     cetirizine (ZYRTEC) 10 MG tablet Take 10 mg by mouth daily.     famotidine (PEPCID) 20 MG tablet One after supper 30 tablet 11   pantoprazole (  PROTONIX) 40 MG tablet TAKE 1 TABLET (40 MG TOTAL) BY MOUTH DAILY. TAKE 30-60 MIN BEFORE FIRST MEAL OF THE DAY 30 tablet 2   rivaroxaban (XARELTO) 20 MG TABS tablet Take 1 tablet (20 mg total) by mouth daily with supper. 30 tablet 11   Spacer/Aero-Hold Chamber Bags MISC Use spacer with albuterol and Advair inhalers. 1 each 0   VITAMIN D3 10 MCG (400 UNIT) tablet Take 800 Units by mouth daily.     fluticasone-salmeterol (ADVAIR) 250-50  MCG/ACT AEPB Inhale 1 puff into the lungs in the morning and at bedtime. 60 each 11   Fluticasone-Umeclidin-Vilant (TRELEGY ELLIPTA) 200-62.5-25 MCG/ACT AEPB Inhale 1 puff into the lungs daily at 6 (six) AM. 14 each 0   No facility-administered medications prior to visit.     Review of Systems:   Constitutional:   No  weight loss, night sweats,  Fevers, chills, fatigue, or  lassitude.  HEENT:   No headaches,  Difficulty swallowing,  Tooth/dental problems, or  Sore throat,                No sneezing, itching, ear ache, nasal congestion, post nasal drip,   CV:  No chest pain,  Orthopnea, PND, swelling in lower extremities, anasarca, dizziness, palpitations, syncope.   GI  No heartburn, indigestion, abdominal pain, nausea, vomiting, diarrhea, change in bowel habits, loss of appetite, bloody stools.   Resp: No shortness of breath with exertion or at rest.  No excess mucus, no productive cough,  No non-productive cough,  No coughing up of blood.  No change in color of mucus.  No wheezing.  No chest wall deformity  Skin: no rash or lesions.  GU: no dysuria, change in color of urine, no urgency or frequency.  No flank pain, no hematuria   MS:  No joint pain or swelling.  No decreased range of motion.  No back pain.    Physical Exam  BP 120/64 (BP Location: Left Arm, Patient Position: Sitting, Cuff Size: Normal)   Pulse 82   Temp 97.9 F (36.6 C) (Oral)   Ht 5\' 1"  (1.549 m)   Wt 178 lb 12.8 oz (81.1 kg)   LMP 05/10/2011   SpO2 100%   BMI 33.78 kg/m   GEN: A/Ox3; pleasant , NAD, well nourished    HEENT:  [place]/AT,   NOSE-clear, THROAT-clear, no lesions, no postnasal drip or exudate noted.   NECK:  Supple w/ fair ROM; no JVD; normal carotid impulses w/o bruits; no thyromegaly or nodules palpated; no lymphadenopathy.    RESP  Clear  P & A; w/o, wheezes/ rales/ or rhonchi. no accessory muscle use, no dullness to percussion  CARD:  RRR, no m/r/g, no peripheral edema, pulses intact,  no cyanosis or clubbing.  GI:   Soft & nt; nml bowel sounds; no organomegaly or masses detected.   Musco: Warm bil, no deformities or joint swelling noted.   Neuro: alert, no focal deficits noted.    Skin: Warm, no lesions or rashes    Lab Results:     BNP No results found for: "BNP"  ProBNP No results found for: "PROBNP"  Imaging: No results found.       Latest Ref Rng & Units 11/02/2022    2:41 PM  PFT Results  FVC-Pre L 2.04   FVC-Predicted Pre % 71   FVC-Post L 2.43   FVC-Predicted Post % 85   Pre FEV1/FVC % % 70   Post FEV1/FCV % % 74  FEV1-Pre L 1.43   FEV1-Predicted Pre % 65   FEV1-Post L 1.81   DLCO uncorrected ml/min/mmHg 19.13   DLCO UNC% % 106   DLCO corrected ml/min/mmHg 19.13   DLCO COR %Predicted % 106   DLVA Predicted % 115   TLC L 4.42   TLC % Predicted % 96   RV % Predicted % 140     No results found for: "NITRICOXIDE"      Assessment & Plan:   Pulmonary embolism (HCC) PE and right DVT-most likely provoked from immobility and dehydration.  Patient is doing well on Xarelto.  She has been seen by hematology with plans for a venous Doppler repeat in September and follow-up visit.  She does have a family history of VTE.  Length of therapy per hematology recommendations Routine screenings of mammogram and GYN referral are pending Patient education on Xarelto given  Plan  Patient Instructions  Continue on Xarelto 20mg  daily Avoid NSAIDs,(No Advil, motrin , aleve, ibuprofen , etc) .  Follow up with Hematology in September as planned  Continue on Trelegy 1 puff daily  Albuterol inhaler As needed  use with spacer .  Zyrtec 10mg  daily As needed  drainage.  Follow up with in 3 months and As needed      Asthma Mild persistent asthma with improved symptom control on Trelegy.  Plan  Patient Instructions  Continue on Xarelto 20mg  daily Avoid NSAIDs,(No Advil, motrin , aleve, ibuprofen , etc) .  Follow up with Hematology in September  as planned  Continue on Trelegy 1 puff daily  Albuterol inhaler As needed  use with spacer .  Zyrtec 10mg  daily As needed  drainage.  Follow up with in 3 months and As needed        [name], NP 12/14/2022

## 2022-12-14 NOTE — Patient Instructions (Addendum)
Continue on Xarelto 20mg  daily Avoid NSAIDs,(No Advil, motrin , aleve, ibuprofen , etc) .  Follow up with Hematology in September as planned  Continue on Trelegy 1 puff daily  Albuterol inhaler As needed  use with spacer .  Zyrtec 10mg  daily As needed  drainage.  Follow up with in 3 months and As needed

## 2022-12-14 NOTE — Assessment & Plan Note (Signed)
Mild persistent asthma with improved symptom control on Trelegy.  Plan  Patient Instructions  Continue on Xarelto 20mg  daily Avoid NSAIDs,(No Advil, motrin , aleve, ibuprofen , etc) .  Follow up with Hematology in September as planned  Continue on Trelegy 1 puff daily  Albuterol inhaler As needed  use with spacer .  Zyrtec 10mg  daily As needed  drainage.  Follow up with in 3 months and As needed

## 2022-12-15 NOTE — Progress Notes (Signed)
Reviewed and agree with assessment/plan.   Coralyn Helling, MD [facility] Pulmonary/Critical Care 12/15/2022, 7:21 AM Pager:  [phone]

## 2022-12-22 ENCOUNTER — Ambulatory Visit
Admission: RE | Admit: 2022-12-22 | Discharge: 2022-12-22 | Disposition: A | Discharge status: Home / Self Care | Payer: 59 | Source: Ambulatory Visit | Attending: Adult Health | Admitting: Adult Health

## 2022-12-22 DIAGNOSIS — Z1231 Encounter for screening mammogram for malignant neoplasm of breast: Secondary | ICD-10-CM | POA: Diagnosis not present

## 2022-12-22 DIAGNOSIS — Z139 Encounter for screening, unspecified: Secondary | ICD-10-CM

## 2022-12-25 ENCOUNTER — Telehealth: Payer: Self-pay | Admitting: Adult Health

## 2022-12-25 DIAGNOSIS — R000 Tachycardia, unspecified: Secondary | ICD-10-CM

## 2022-12-26 ENCOUNTER — Other Ambulatory Visit: Payer: Self-pay | Admitting: Adult Health

## 2022-12-26 DIAGNOSIS — R928 Other abnormal and inconclusive findings on diagnostic imaging of breast: Secondary | ICD-10-CM

## 2022-12-26 NOTE — Telephone Encounter (Signed)
Advised of test results. She is aware to return Breast center call to schedule Dx Mammo./US    While on phone patient complains of tachycardia on/off for last 3-4 months with HR ~110 that comes and goes , Watch alerts her. This is happening at rest . No chest pain, palpitations or jaw pain. Wants Cardiology referral .   Please refer to cardiology for tachycardia. Advised if continues/does not resolve . And/or develops sx with chest pain, palpitations, dizziness, dyspnea needs to go to ER.  Marland KitchenPlease contact office for sooner follow up if symptoms do not improve or worsen or seek emergency care

## 2022-12-26 NOTE — Telephone Encounter (Signed)
Patient called back to confirm asking if she need a follow up appointment with [place]. Advised patient per Tammy's notes unless symptoms persist or worsen. Also advised that someone else will reach out to her regarding cardiology appointment not [place].   Also patient states that she was going to have a possible repeat mammogram? Please advise if this is what patient needs and call back.

## 2022-12-26 NOTE — Telephone Encounter (Signed)
Breast center has reached out to her to set up Diagnostic Mammo/US. She needs to call them back as she missed their phone call  to schedule.   She is suppose to follow up in late Sept for routine follow up from last ov in June.   Cardiology referral was placed .

## 2022-12-28 ENCOUNTER — Ambulatory Visit
Admission: RE | Admit: 2022-12-28 | Discharge: 2022-12-28 | Disposition: A | Discharge status: Home / Self Care | Payer: 59 | Source: Ambulatory Visit | Attending: Adult Health | Admitting: Adult Health

## 2022-12-28 DIAGNOSIS — N6313 Unspecified lump in the right breast, lower outer quadrant: Secondary | ICD-10-CM | POA: Diagnosis not present

## 2022-12-28 DIAGNOSIS — R928 Other abnormal and inconclusive findings on diagnostic imaging of breast: Secondary | ICD-10-CM

## 2022-12-28 DIAGNOSIS — N6001 Solitary cyst of right breast: Secondary | ICD-10-CM | POA: Diagnosis not present

## 2022-12-28 NOTE — Progress Notes (Signed)
Nothing further needed 

## 2023-01-19 ENCOUNTER — Telehealth: Payer: Self-pay

## 2023-01-19 NOTE — Telephone Encounter (Signed)
Sent a mychart message regarding 9/5 appt.

## 2023-01-19 NOTE — Telephone Encounter (Signed)
Attempted to call to give appt for doppler at [facility] on 9/5 at 2 pm. Will try to call her back again.

## 2023-01-24 ENCOUNTER — Encounter: Payer: Self-pay | Admitting: Radiology

## 2023-01-24 ENCOUNTER — Ambulatory Visit (INDEPENDENT_AMBULATORY_CARE_PROVIDER_SITE_OTHER): Payer: 59 | Admitting: Radiology

## 2023-01-24 ENCOUNTER — Other Ambulatory Visit (HOSPITAL_COMMUNITY)
Admission: RE | Admit: 2023-01-24 | Discharge: 2023-01-24 | Disposition: A | Discharge status: Home / Self Care | Payer: 59 | Source: Ambulatory Visit | Attending: Radiology | Admitting: Radiology

## 2023-01-24 VITALS — BP 118/70 | Ht 61.0 in | Wt 180.0 lb

## 2023-01-24 DIAGNOSIS — Z780 Asymptomatic menopausal state: Secondary | ICD-10-CM

## 2023-01-24 DIAGNOSIS — R1031 Right lower quadrant pain: Secondary | ICD-10-CM | POA: Diagnosis not present

## 2023-01-24 DIAGNOSIS — Z01419 Encounter for gynecological examination (general) (routine) without abnormal findings: Secondary | ICD-10-CM

## 2023-01-24 NOTE — Progress Notes (Signed)
Christy [name] 1960-09-16 [id number]   History: Postmenopausal 62 y.o. presents for annual exam. C/o rlq pain x 1 year and weight gain, worse over the past month. Has some more SOB as well over the past month. Has appt scheduled with pulmonologist. Denies any other symptoms. Last OV 2016 for PMB, none since.   Gynecologic History Postmenopausal Last Pap: 2016. Results were: normal Last mammogram: 2024. Results were: normal Last colonoscopy: 2022 DEXA:never  Obstetric History OB History  Gravida Para Term Preterm AB Living  8 4 4  0 4 4  SAB IAB Ectopic Multiple Live Births  4   0        # Outcome Date GA Lbr Len/2nd Weight Sex Type Anes PTL Lv  8 SAB           7 Term           6 Term           5 Term           4 Term           3 SAB           2 SAB           1 SAB              The following portions of the patient's history were reviewed and updated as appropriate: allergies, current medications, past family history, past medical history, past social history, past surgical history, and problem list.  Review of Systems Pertinent items noted in HPI and remainder of comprehensive ROS otherwise negative.  Past medical history, past surgical history, family history and social history were all reviewed and documented in the EPIC chart.  Exam:  Vitals:   01/24/23 1426  BP: 118/70  Weight: 180 lb (81.6 kg)  Height: 5\' 1"  (1.549 m)   Body mass index is 34.01 kg/m.  General appearance:  Normal Thyroid:  Symmetrical, normal in size, without palpable masses or nodularity. Respiratory  Auscultation:  Clear without wheezing or rhonchi Cardiovascular  Auscultation:  Regular rate, without rubs, murmurs or gallops  Edema/varicosities:  Not grossly evident Abdominal  Soft,nontender, without masses, guarding or rebound.  Liver/spleen:  No organomegaly noted  Hernia:  None appreciated  Skin  Inspection:  Grossly normal Breasts: Examined lying and sitting.   Right: Without  masses, retractions, nipple discharge or axillary adenopathy.   Left: Without masses, retractions, nipple discharge or axillary adenopathy. Genitourinary   Inguinal/mons:  Normal without inguinal adenopathy  External genitalia:  Normal appearing vulva with no masses, tenderness, or lesions  BUS/Urethra/Skene's glands:  Normal  Vagina:  Normal appearing with normal color and discharge, no lesions. Atrophy: mild   Cervix:  Normal appearing without discharge or lesions  Uterus:  Normal in size, shape and contour.  Midline and mobile, nontender  Adnexa/parametria:     Rt: Unable to palpate on exam, tender and guarding RLQ with palpation   Lt: Normal in size, without masses or tenderness.  Anus and perineum: Normal    Raynelle Fanning, CMA present for exam  Assessment/Plan:   1. Well woman exam with routine gynecological exam - Cytology - PAP( [place])  2. Asymptomatic postmenopausal estrogen deficiency - DG Bone Density; Future  3. RLQ abdominal pain - US PELVIC COMPLETE WITH TRANSVAGINAL; Future    Discussed SBE, colonoscopy and DEXA screening as directed. Recommend of exercise weekly, including weight bearing exercise. Encouraged the use of seatbelts and sunscreen.  Return in 1  year for annual or sooner prn.  Arlie Solomons B WHNP-BC, 2:47 PM 01/24/2023

## 2023-02-05 NOTE — Progress Notes (Unsigned)
Cardiology Office Note:   Date:  02/06/2023  NAME:  Christy [name]    MRN: [id number] DOB:  1960/09/29   PCP:  [name], NP  Cardiologist:  Reatha Harps, MD  Electrophysiologist:  None   Referring MD: Julio Sicks, NP   Chief Complaint  Patient presents with   Tachycardia         History of Present Illness:   Christy [name] is a 62 y.o. female with a hx of PE/DVT who is being seen today for the evaluation of tachycardia at the request of Parrett, Virgel Bouquet, NP.  She reports for the past 2 months she has noticed heart racing episodes.  Her watch will notify her that her heart rate is in the 130s.  She can feel it sometimes but often does not.  Commonly feels this while laying down.  Symptoms can last seconds.  She occurs the episodes occur 2-3 times per week.  No triggers or alleviating factors noted.  Symptoms resolved and start without any change in what she is doing.  She also describes intermittent sharp chest discomfort.  Occurs in the right and left chest.  Brief.  Infrequent.  She was diagnosed with pulmonary emboli in March of this year.  Thought to be related to obesity and inactivity.  On Xarelto.  Likely will continue indefinitely.  No recent TSH.  Hemoglobin value 11.3.  No bleeding.  Has not had her hemoglobin value rechecked.  No history of high blood pressure.  She is not diabetic.  She does describe several family members who had blood clots.  She is originally from [place].  She is retired.  She is married.  She has 4 children.  She has 8 grandchildren.  She reports no tobacco use.  Reports alcohol use in moderation.  No drug use.  CV exam unremarkable.  EKG normal.  Symptoms could represent arrhythmia.  Unclear what is going on.  We discussed a monitor for further evaluation.  Her echo in the hospital was normal.  CT Chest 08/21/2022 -> 0 CAC  Problem List PE/DVT -08/2022 2. 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   cetirizine (ZYRTEC) 10 MG tablet Take 10 mg by mouth as needed.   diphenhydrAMINE-APAP, sleep, (TYLENOL PM EXTRA STRENGTH PO) Take by mouth.   famotidine (PEPCID) 20 MG tablet One after supper (Patient taking differently: Take 20 mg by mouth as needed. One after supper)   Fluticasone-Umeclidin-Vilant (TRELEGY ELLIPTA) 200-62.5-25 MCG/ACT AEPB Inhale 1 puff into the lungs daily at 6 (six) AM.   pantoprazole (PROTONIX) 40 MG tablet TAKE 1 TABLET (40 MG TOTAL) BY MOUTH DAILY. TAKE 30-60 MIN BEFORE FIRST MEAL OF THE DAY   rivaroxaban (XARELTO) 20 MG TABS tablet Take 1 tablet (20 mg total) by mouth daily with supper.   Spacer/Aero-Hold [employer]  Use spacer with albuterol and Advair inhalers.   VITAMIN D3 10 MCG (400 UNIT) tablet Take 800 Units by mouth daily.     Allergies:    Penicillins   Social History: Social History   Socioeconomic History   Marital status: Married    Spouse name: Not on file   Number of children: 4   Years of education: Not on file   Highest education level: Not on file  Occupational History   Not on file  Tobacco Use   Smoking status: Never    Passive exposure: Current   Smokeless tobacco: Never  Vaping Use   Vaping status: Never Used  Substance and Sexual Activity   Alcohol use: Yes    Comment: wine and beer occ   Drug use: No   Sexual activity: Yes    Partners: Male    Birth control/protection: Post-menopausal     Comment: intercourse age 81, sexual partners more than 5  Other Topics Concern   Not on file  Social History Narrative   Not on file   Social Determinants of Health   Financial Resource Strain: Not on file  Food Insecurity: No Food Insecurity (10/09/2022)   Hunger Vital Sign    Worried About Running Out of Food in the Last Year: Never true    Ran Out of Food in the Last Year: Never true  Transportation Needs: No Transportation Needs (10/09/2022)   PRAPARE - Administrator, Civil Service (Medical): No    Lack of Transportation (Non-Medical): No  Physical Activity: Not on file  Stress: Not on file  Social Connections: Not on file     Family History: The patient's family history includes Cancer in her father; Clotting disorder in her mother; Heart disease in her mother; Hypertension in her father and mother; Lupus in her daughter; Prostate cancer in her brother and father; Stroke in her mother. There is no history of Colon cancer, Colon polyps, Esophageal cancer, Rectal cancer, Stomach cancer, or Breast cancer.  ROS:   All other ROS reviewed and negative. Pertinent positives noted in the HPI.     EKGs/Labs/Other Studies Reviewed:   The following studies were personally reviewed by me today:  EKG:  EKG is ordered today.    EKG Interpretation Date/Time:  Tuesday February 06 2023 09:47:03 EDT Ventricular Rate:  74 PR Interval:  136 QRS Duration:  78 QT Interval:  392 QTC Calculation: 435 R Axis:   60  Text Interpretation: Normal sinus rhythm Normal ECG Confirmed by Lennie Odor [phone]) on 02/06/2023 9:48:00 AM   TTE 10/04/2022  1. Left ventricular ejection fraction, by estimation, is 60 to 65%. The  left ventricle has normal function. The left ventricle has no regional  wall motion abnormalities. Left ventricular diastolic parameters are  consistent with Grade I diastolic  dysfunction (impaired relaxation). The average left ventricular global  longitudinal strain is  -17.6 %. Making abstraction of the poor tracking of  the inferolateral wall in the apical 3 strain rate image, the estimated  global longitudinal strain is in the   -19-20% range.   2. Right ventricular systolic function is normal. The right ventricular  size is normal. There is normal pulmonary artery systolic pressure. The  estimated right ventricular systolic pressure is 25.8 mmHg.   3. The mitral valve is normal in structure. Trivial mitral valve  regurgitation. No evidence of mitral stenosis.   4. The aortic valve is tricuspid. Aortic valve regurgitation is not  visualized. No  aortic stenosis is present.   5. The inferior vena cava is normal in size with greater than 50%  respiratory variability, suggesting right atrial pressure of 3 mmHg.   Recent Labs: 08/22/2022: BUN 19; Creatinine, Ser 0.97; Hemoglobin 11.3; Platelets 374; Potassium 3.5; Sodium 140   Recent Lipid Panel No results found for: "CHOL", "TRIG", "HDL", "CHOLHDL", "VLDL", "LDLCALC", "LDLDIRECT"  Physical Exam:   VS:  BP 118/76 (BP Location: Left Arm, Patient Position: Sitting, Cuff Size: Normal)   Pulse 74   Ht 5\' 1"  (1.549 m)   Wt 179 lb 9.6 oz (81.5 kg)   LMP 05/10/2011   SpO2 96%   BMI 33.94 kg/m    Wt Readings from Last 3 Encounters:  02/06/23 179 lb 9.6 oz (81.5 kg)  01/24/23 180 lb (81.6 kg)  12/14/22 178 lb 12.8 oz (81.1 kg)    General: Well nourished, well developed, in no acute distress Head: Atraumatic, normal size  Eyes: PEERLA, EOMI  Neck: Supple, no JVD Endocrine: No thryomegaly Cardiac: Normal S1, S2; RRR; no murmurs, rubs, or gallops Lungs: Clear to auscultation bilaterally, no wheezing, rhonchi or rales  Abd: Soft, nontender, no hepatomegaly  Ext: No edema, pulses 2+ Musculoskeletal: No deformities, BUE and BLE strength normal and equal Skin: Warm and dry, no rashes   Neuro: Alert and oriented to person, place, time, and situation, CNII-XII grossly intact, no focal deficits  Psych: Normal  mood and affect   ASSESSMENT:   Christy [name] is a 62 y.o. female who presents for the following: 1. Palpitations   2. Tachycardia   3. Precordial pain     PLAN:   1. Palpitations 2. Tachycardia -Describes heart rate monitor on her watch that detects heart rates in the 130s.  Does have some symptoms from this.  Occurs 2-3 times per week.  Last seconds.  Could represent arrhythmia.  Would recommend a TSH and CBC.  She needs a 14-day ZIO to exclude arrhythmia.  Echo in the hospital was normal.  She will see me back as needed based on the results of testing.  Symptoms could also be stress related.  She does describe a lot of medical issues this year.  3. Precordial pain -Sharp chest discomfort.  Infrequent.  In right and left chest.  No coronary calcium noted on recent CT chest.  EKG normal.  CV exam normal.  Suspect this is noncardiac.  No further testing needed.  Disposition: Return if symptoms worsen or fail to improve.  Medication Adjustments/Labs and Tests Ordered: Current medicines are reviewed at length with the patient today.  Concerns regarding medicines are outlined above.  Orders Placed This Encounter  Procedures   TSH   CBC   LONG TERM MONITOR (3-14 DAYS)   EKG 12-Lead   No orders of the defined types were placed in this encounter.  Patient Instructions  Medication Instructions:  The current medical regimen is effective;  continue present plan and medications as directed. Please refer to the Current Medication list given to you today.  *If you need a refill on your cardiac medications before your next appointment, please call your pharmacy*  Lab Work: CBC AND TSH If you have labs (blood work) drawn today and your tests are completely normal, you will receive your results only by:  MyChart Message (if you have MyChart) OR A paper copy in the mail If you have any lab test that is abnormal or we need to change your treatment, we will call you to review the  results.  Testing/Procedures: NONE  Follow-Up: At [facility], you and your health needs are our priority.  As part of our continuing mission to provide you with exceptional heart care, we have created designated Provider Care Teams.  These Care Teams include your primary Cardiologist (physician) and Advanced Practice Providers (APPs -  Physician Assistants and Nurse Practitioners) who all work together to provide you with the care you need, when you need it.  Your next appointment:   AS NEEDED   Provider:   Reatha Harps, MD     Other Instructions Christena Deem- Long Term Monitor Instructions  Your physician has requested you wear a ZIO patch monitor for 14 days.  This is a single patch monitor. [employer] supplies one patch monitor per enrollment. Additional stickers are not available. Please do not apply patch if you will be having a Nuclear Stress Test,  Echocardiogram, Cardiac CT, MRI, or Chest Xray during the period you would be wearing the  monitor. The patch cannot be worn during these tests. You cannot remove and re-apply the  ZIO XT patch monitor.  Your ZIO patch monitor will be mailed 3 day USPS to your address on file. It may take 3-5 days  to receive your monitor after you have been enrolled.  Once you have received your monitor, please review the enclosed instructions. Your monitor  has already been registered assigning a specific monitor serial # to you.  Billing and Patient Assistance Program Information  We have supplied [employer] with any of your insurance information on file for billing purposes. [employer] offers a sliding scale Patient Assistance Program for patients that do not have  insurance, or whose insurance does not completely cover the cost of the ZIO monitor.  You must apply for the Patient Assistance Program to qualify for this discounted rate.  To apply, please call [employer] at [phone], select option 4, select option 2, ask to apply for  Patient  Assistance Program. Meredeth Ide will ask your household income, and how many people  are in your household. They will quote your out-of-pocket cost based on that information.  [employer] will also be able to set up a 73-month, interest-free payment plan if needed.  Applying the monitor   Shave hair from upper left chest.  Hold abrader disc by orange tab. Rub abrader in 40 strokes over the upper left chest as  indicated in your monitor instructions.  Clean area with 4 enclosed alcohol pads. Let dry.  Apply patch as indicated in monitor instructions. Patch will be placed under collarbone on left  side of chest with arrow pointing upward.  Rub patch adhesive wings for 2 minutes. Remove white label marked "1". Remove the white  label marked "2". Rub patch adhesive wings for 2 additional minutes.  While looking in a mirror, press and release button in center of patch. A small green light will  flash 3-4 times. This will be your only indicator that the monitor has been turned on.  Do not shower for the first 24 hours. You may shower after the first 24 hours.  Press the button if you feel a symptom. You will hear a small click. Record Date, Time and  Symptom in the Patient Logbook.  When you are ready to remove the patch, follow instructions on the last 2 pages of Patient  Logbook. Stick patch monitor onto the last page of Patient Logbook.  Place Patient Logbook in the blue and white box. Use locking tab on box and  tape box closed  securely. The blue and white box has prepaid postage on it. Please place it in the mailbox as  soon as possible. Your physician should have your test results approximately 7 days after the  monitor has been mailed back to [facility].  Call [facility] Customer Care at [phone] if you have questions regarding  your ZIO XT patch monitor. Call them immediately if you see an orange light blinking on your  monitor.  If your monitor falls off in less than 4 days,  contact our Monitor department at [phone].  If your monitor becomes loose or falls off after 4 days call [employer] at [phone] for  suggestions on securing your monitor    Signed, Lenna Gilford. Flora Lipps, MD, [facility]  [address] [place], [place] [address] [phone]  02/06/2023 11:00 AM

## 2023-02-06 ENCOUNTER — Encounter: Payer: Self-pay | Admitting: Cardiovascular Disease

## 2023-02-06 ENCOUNTER — Ambulatory Visit (INDEPENDENT_AMBULATORY_CARE_PROVIDER_SITE_OTHER): Payer: 59

## 2023-02-06 ENCOUNTER — Ambulatory Visit: Payer: 59 | Attending: Cardiovascular Disease | Admitting: Cardiovascular Disease

## 2023-02-06 VITALS — BP 118/76 | HR 74 | Ht 61.0 in | Wt 179.6 lb

## 2023-02-06 DIAGNOSIS — R072 Precordial pain: Secondary | ICD-10-CM | POA: Diagnosis not present

## 2023-02-06 DIAGNOSIS — R002 Palpitations: Secondary | ICD-10-CM | POA: Diagnosis not present

## 2023-02-06 DIAGNOSIS — R000 Tachycardia, unspecified: Secondary | ICD-10-CM | POA: Diagnosis not present

## 2023-02-06 NOTE — Progress Notes (Unsigned)
Enrolled for [employer] to mail a ZIO XT long term holter monitor to the patients address on file.  

## 2023-02-06 NOTE — Patient Instructions (Addendum)
Medication Instructions:  The current medical regimen is effective;  continue present plan and medications as directed. Please refer to the Current Medication list given to you today.  *If you need a refill on your cardiac medications before your next appointment, please call your pharmacy*  Lab Work: CBC AND TSH If you have labs (blood work) drawn today and your tests are completely normal, you will receive your results only by:  MyChart Message (if you have MyChart) OR A paper copy in the mail If you have any lab test that is abnormal or we need to change your treatment, we will call you to review the results.  Testing/Procedures: NONE  Follow-Up: At [facility], you and your health needs are our priority.  As part of our continuing mission to provide you with exceptional heart care, we have created designated Provider Care Teams.  These Care Teams include your primary Cardiologist (physician) and Advanced Practice Providers (APPs -  Physician Assistants and Nurse Practitioners) who all work together to provide you with the care you need, when you need it.  Your next appointment:   AS NEEDED   Provider:   Reatha Harps, MD     Other Instructions Christena Deem- Long Term Monitor Instructions  Your physician has requested you wear a ZIO patch monitor for 14 days.  This is a single patch monitor. [employer] supplies one patch monitor per enrollment. Additional stickers are not available. Please do not apply patch if you will be having a Nuclear Stress Test,  Echocardiogram, Cardiac CT, MRI, or Chest Xray during the period you would be wearing the  monitor. The patch cannot be worn during these tests. You cannot remove and re-apply the  ZIO XT patch monitor.  Your ZIO patch monitor will be mailed 3 day USPS to your address on file. It may take 3-5 days  to receive your monitor after you have been enrolled.  Once you have received your monitor, please review the enclosed instructions.  Your monitor  has already been registered assigning a specific monitor serial # to you.  Billing and Patient Assistance Program Information  We have supplied [employer] with any of your insurance information on file for billing purposes. [employer] offers a sliding scale Patient Assistance Program for patients that do not have  insurance, or whose insurance does not completely cover the cost of the ZIO monitor.  You must apply for the Patient Assistance Program to qualify for this discounted rate.  To apply, please call [employer] at [phone], select option 4, select option 2, ask to apply for  Patient Assistance Program. Meredeth Ide will ask your household income, and how many people  are in your household. They will quote your out-of-pocket cost based on that information.  [employer] will also be able to set up a 78-month, interest-free payment plan if needed.  Applying the monitor   Shave hair from upper left chest.  Hold abrader disc by orange tab. Rub abrader in 40 strokes over the upper left chest as  indicated in your monitor instructions.  Clean area with 4 enclosed alcohol pads. Let dry.  Apply patch as indicated in monitor instructions. Patch will be placed under collarbone on left  side of chest with arrow pointing upward.  Rub patch adhesive wings for 2 minutes. Remove white label marked "1". Remove the white  label marked "2". Rub patch adhesive wings for 2 additional minutes.  While looking in a mirror, press and release button in center of patch.  A small green light will  flash 3-4 times. This will be your only indicator that the monitor has been turned on.  Do not shower for the first 24 hours. You may shower after the first 24 hours.  Press the button if you feel a symptom. You will hear a small click. Record Date, Time and  Symptom in the Patient Logbook.  When you are ready to remove the patch, follow instructions on the last 2 pages of Patient  Logbook. Stick patch monitor onto  the last page of Patient Logbook.  Place Patient Logbook in the blue and white box. Use locking tab on box and tape box closed  securely. The blue and white box has prepaid postage on it. Please place it in the mailbox as  soon as possible. Your physician should have your test results approximately 7 days after the  monitor has been mailed back to [facility]  Call [facility] Customer Care at [phone] if you have questions regarding  your ZIO XT patch monitor. Call them immediately if you see an orange light blinking on your  monitor.  If your monitor falls off in less than 4 days, contact our Monitor department at [phone].  If your monitor becomes loose or falls off after 4 days call [employer] at [phone] for  suggestions on securing your monitor

## 2023-02-07 LAB — CBC
Hematocrit: 36.6 % (ref 34.0–46.6)
Hemoglobin: 11.6 g/dL (ref 11.1–15.9)
MCH: 24.5 pg — ABNORMAL LOW (ref 26.6–33.0)
MCHC: 31.7 g/dL (ref 31.5–35.7)
MCV: 77 fL — ABNORMAL LOW (ref 79–97)
Platelets: 450 10*3/uL (ref 150–450)
RBC: 4.73 x10E6/uL (ref 3.77–5.28)
RDW: 14.4 % (ref 11.7–15.4)
WBC: 6.4 10*3/uL (ref 3.4–10.8)

## 2023-02-07 LAB — TSH: TSH: 1.67 u[IU]/mL (ref 0.450–4.500)

## 2023-02-09 DIAGNOSIS — R002 Palpitations: Secondary | ICD-10-CM | POA: Diagnosis not present

## 2023-02-22 ENCOUNTER — Ambulatory Visit (HOSPITAL_BASED_OUTPATIENT_CLINIC_OR_DEPARTMENT_OTHER)
Admission: RE | Admit: 2023-02-22 | Discharge: 2023-02-22 | Disposition: A | Discharge status: Home / Self Care | Payer: 59 | Source: Ambulatory Visit | Attending: Hematology and Oncology | Admitting: Hematology and Oncology

## 2023-02-22 ENCOUNTER — Inpatient Hospital Stay: Payer: 59 | Attending: Hematology and Oncology

## 2023-02-22 DIAGNOSIS — I2699 Other pulmonary embolism without acute cor pulmonale: Secondary | ICD-10-CM | POA: Diagnosis not present

## 2023-02-22 DIAGNOSIS — I824Z1 Acute embolism and thrombosis of unspecified deep veins of right distal lower extremity: Secondary | ICD-10-CM | POA: Insufficient documentation

## 2023-02-22 DIAGNOSIS — Z7901 Long term (current) use of anticoagulants: Secondary | ICD-10-CM | POA: Diagnosis not present

## 2023-02-22 LAB — CBC WITH DIFFERENTIAL (CANCER CENTER ONLY)
Abs Immature Granulocytes: 0.01 10*3/uL (ref 0.00–0.07)
Basophils Absolute: 0 10*3/uL (ref 0.0–0.1)
Basophils Relative: 0 %
Eosinophils Absolute: 0.3 10*3/uL (ref 0.0–0.5)
Eosinophils Relative: 4 %
HCT: 37.2 % (ref 36.0–46.0)
Hemoglobin: 11.7 g/dL — ABNORMAL LOW (ref 12.0–15.0)
Immature Granulocytes: 0 %
Lymphocytes Relative: 37 %
Lymphs Abs: 2.8 10*3/uL (ref 0.7–4.0)
MCH: 24.5 pg — ABNORMAL LOW (ref 26.0–34.0)
MCHC: 31.5 g/dL (ref 30.0–36.0)
MCV: 77.8 fL — ABNORMAL LOW (ref 80.0–100.0)
Monocytes Absolute: 0.7 10*3/uL (ref 0.1–1.0)
Monocytes Relative: 9 %
Neutro Abs: 3.7 10*3/uL (ref 1.7–7.7)
Neutrophils Relative %: 50 %
Platelet Count: 420 10*3/uL — ABNORMAL HIGH (ref 150–400)
RBC: 4.78 MIL/uL (ref 3.87–5.11)
RDW: 14.6 % (ref 11.5–15.5)
WBC Count: 7.5 10*3/uL (ref 4.0–10.5)
nRBC: 0 % (ref 0.0–0.2)

## 2023-02-22 LAB — BASIC METABOLIC PANEL - CANCER CENTER ONLY
Anion gap: 6 (ref 5–15)
BUN: 12 mg/dL (ref 8–23)
CO2: 28 mmol/L (ref 22–32)
Calcium: 9.1 mg/dL (ref 8.9–10.3)
Chloride: 107 mmol/L (ref 98–111)
Creatinine: 0.98 mg/dL (ref 0.44–1.00)
GFR, Estimated: >60 mL/min (ref >60)
Glucose, Bld: 93 mg/dL (ref 70–99)
Potassium: 4.1 mmol/L (ref 3.5–5.1)
Sodium: 141 mmol/L (ref 135–145)

## 2023-02-22 LAB — D-DIMER, QUANTITATIVE: D-Dimer, Quant: <0.27 ug{FEU}/mL (ref 0.00–0.50)

## 2023-02-22 NOTE — Progress Notes (Signed)
[name] OFFICE PROGRESS NOTE  Christy Agee, NP  ASSESSMENT & PLAN:  Acute pulmonary embolism without acute cor pulmonale (HCC) She has completed 6 months of anticoagulation therapy We discussed risk and benefits of continuing Xarelto for secondary prevention The patient will think about it  Acute deep vein thrombosis (DVT) of distal end of right lower extremity (HCC) Repeat ultrasound showed no evidence of DVT D-dimer is negative We discussed risk and benefits of pursuing thrombophilia workup We discussed risk and benefits of secondary prevention with low-dose Xarelto versus aspirin therapy Ultimately, the patient will continue her Xarelto at reduced dose until her prescription runs out If she is interested to pursue thrombophilia workup, she will call me and let me know At this point in time, she stated she had multiple appointments with different specialties and would like to hold off additional test Previously, I identify reduced oral fluid intake and sedentary lifestyle as potential risk factors and the patient is attempting to drink more liquids and increase physical activity   No orders of the defined types were placed in this encounter.   The total time spent in the appointment was 30 minutes encounter with patients including review of chart and various tests results, discussions about plan of care and coordination of care plan   All questions were answered. The patient knows to call the clinic with any problems, questions or concerns. No barriers to learning was detected.    Artis Delay, MD 9/12/20243:36 PM  INTERVAL HISTORY: Christy [name] 62 y.o. female returns for further evaluation due to history of DVT and PE Since our last visit, she felt fine No recent bleeding complications from Xarelto We discussed test results and future plan of care  SUMMARY OF HEMATOLOGIC HISTORY:  HISTORY OF PRESENTING ILLNESS:  Christy [name] 62 y.o. female is  here because of recent diagnosis of DVT and PE The patient states that she has not been feeling well since March 2023 The year before that, around March 2022, she fell down her stairs.  She was not evaluated formally for injury after the fall She started to notice some significant shortness of breath and reduced stamina on exertion.  She also have some intermittent nonproductive cough She follows with pulmonologist for lung nodule for some time She noted some increased sedentary lifestyle due to excessive fatigue over the past year At baseline, she does not drink water Her only oral fluid intake is in the form of coffee which she drink 2 to 3 cups/day.  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  Her brother had prostate cancer. In terms of cancer screening, she has not had mammogram for a while.  Her last Pap smear was more than 5 years ago She had colonoscopy screening in 2022  Due to her worsening shortness of breath, she was originally evaluated at urgent care. Subsequently, she was evaluated again by her pulmonologist who ordered a CT imaging that came back abnormal.  She also have venous Doppler ultrasound that confirmed right lower extremity DVT All the changes noted on her imaging were acute but the patient felt that her symptoms has begun more than a year ago  She also have noticed some intermittent tingling and cramping sensation in her fingers and toes.  They improve when she started taking vitamin B12 supplement  She denies lower extremity swelling, warmth, tenderness & erythema.  She denies recent chest pain on exertion, pre-syncopal episodes, hemoptysis, or palpitation. She  denies recent history of trauma, long distance travel or recent surgery She had no prior history or diagnosis of cancer.  She had prior surgeries before and never had perioperative thromboembolic events. The patient had never been exposed to birth control pills or hormone replacement therapy  The patient had been pregnant before and denies history of peripartum thromboembolic event or history of recurrent miscarriages.  She denies bleeding complications from Xarelto On February 22, 2023, repeat ultrasound showed resolution of prior DVT  I have reviewed the past medical history, past surgical history, social history and family history with the patient and they are unchanged from previous note.  ALLERGIES:  is allergic to penicillins.  MEDICATIONS:  Current Outpatient Medications  Medication Sig Dispense Refill   albuterol (VENTOLIN HFA) 108 (90 Base) MCG/ACT inhaler Inhale 2 puffs into the lungs as needed for wheezing or shortness of breath.     cetirizine (ZYRTEC) 10 MG tablet Take 10 mg by mouth as needed.     diphenhydrAMINE-APAP, sleep, (TYLENOL PM EXTRA STRENGTH PO) Take by mouth.     famotidine (PEPCID) 20 MG tablet One after supper (Patient taking differently: Take 20 mg by mouth as needed. One after supper) 30 tablet 11   Fluticasone-Umeclidin-Vilant (TRELEGY ELLIPTA) 200-62.5-25 MCG/ACT AEPB Inhale 1 puff into the lungs daily at 6 (six) AM. 1 each 5   pantoprazole (PROTONIX) 40 MG tablet TAKE 1 TABLET (40 MG TOTAL) BY MOUTH DAILY. TAKE 30-60 MIN BEFORE FIRST MEAL OF THE DAY 30 tablet 2   rivaroxaban (XARELTO) 10 MG TABS tablet Take 1 tablet (10 mg total) by mouth daily with supper.     Spacer/Aero-Hold Chamber Bags MISC Use spacer with albuterol and Advair inhalers. 1 each 0   VITAMIN D3 10 MCG (400 UNIT) tablet Take 800 Units by mouth daily.     No current facility-administered medications for this visit.     REVIEW OF SYSTEMS:   Constitutional: Denies fevers, chills or night  sweats Eyes: Denies blurriness of vision Ears, nose, mouth, throat, and face: Denies mucositis or sore throat Respiratory: Denies cough, dyspnea or wheezes Cardiovascular: Denies palpitation, chest discomfort or lower extremity swelling Gastrointestinal:  Denies nausea, heartburn or change in bowel habits Skin: Denies abnormal skin rashes Lymphatics: Denies new lymphadenopathy or easy bruising Neurological:Denies numbness, tingling or new weaknesses Behavioral/Psych: Mood is stable, no new changes  All other systems were reviewed with the patient and are negative.  PHYSICAL EXAMINATION: ECOG PERFORMANCE STATUS: 0 - Asymptomatic  Vitals:   03/01/23 1232  BP: 114/67  Pulse: 84  Resp: 18  Temp: 98.5 F (36.9 C)  SpO2: 98%   Filed Weights   03/01/23 1232  Weight: 176 lb 9.6 oz (80.1 kg)    GENERAL:alert, no distress and comfortable  LABORATORY DATA:  I have reviewed the data as listed     Component Value Date/Time   NA 141 02/22/2023 1312   K 4.1 02/22/2023 1312   CL 107 02/22/2023 1312   CO2 28 02/22/2023 1312   GLUCOSE 93 02/22/2023 1312   BUN 12 02/22/2023 1312   CREATININE 0.98 02/22/2023 1312   CREATININE 1.06 10/30/2014 1316   CALCIUM 9.1 02/22/2023 1312   PROT 7.7  10/25/2021 1452   ALBUMIN 4.3 10/25/2021 1452   AST 16 10/25/2021 1452   ALT 14 10/25/2021 1452   ALKPHOS 55 10/25/2021 1452   BILITOT 0.3 10/25/2021 1452   GFRNONAA >60 02/22/2023 1312   GFRAA 61 (L) 03/22/2012 1231    No results found for: "SPEP", "UPEP"  Lab Results  Component Value Date   WBC 7.5 02/22/2023   NEUTROABS 3.7 02/22/2023   HGB 11.7 (L) 02/22/2023   HCT 37.2 02/22/2023   MCV 77.8 (L) 02/22/2023   PLT 420 (H) 02/22/2023      Chemistry      Component Value Date/Time   NA 141 02/22/2023 1312   K 4.1 02/22/2023 1312   CL 107 02/22/2023 1312   CO2 28 02/22/2023 1312   BUN 12 02/22/2023 1312   CREATININE 0.98 02/22/2023 1312   CREATININE 1.06 10/30/2014 1316       Component Value Date/Time   CALCIUM 9.1 02/22/2023 1312   ALKPHOS 55 10/25/2021 1452   AST 16 10/25/2021 1452   ALT 14 10/25/2021 1452   BILITOT 0.3 10/25/2021 1452

## 2023-02-22 NOTE — Progress Notes (Signed)
VASCULAR LAB    Right lower extremity venous duplex has been performed.  See CV proc for preliminary results.  Left message VM   Surabhi Gadea, RVT 02/22/2023, 2:27 PM

## 2023-03-01 ENCOUNTER — Encounter: Payer: Self-pay | Admitting: Hematology and Oncology

## 2023-03-01 ENCOUNTER — Inpatient Hospital Stay: Payer: 59 | Admitting: Hematology and Oncology

## 2023-03-01 VITALS — BP 114/67 | HR 84 | Temp 98.5°F | Resp 18 | Ht 61.0 in | Wt 176.6 lb

## 2023-03-01 DIAGNOSIS — R002 Palpitations: Secondary | ICD-10-CM | POA: Diagnosis not present

## 2023-03-01 DIAGNOSIS — I824Z1 Acute embolism and thrombosis of unspecified deep veins of right distal lower extremity: Secondary | ICD-10-CM

## 2023-03-01 DIAGNOSIS — I2699 Other pulmonary embolism without acute cor pulmonale: Secondary | ICD-10-CM | POA: Diagnosis not present

## 2023-03-01 DIAGNOSIS — Z7901 Long term (current) use of anticoagulants: Secondary | ICD-10-CM | POA: Diagnosis not present

## 2023-03-01 MED ORDER — RIVAROXABAN 10 MG PO TABS: 10.0000 mg | ORAL_TABLET | Freq: Every day | ORAL | Status: DC

## 2023-03-01 NOTE — Assessment & Plan Note (Signed)
She has completed 6 months of anticoagulation therapy We discussed risk and benefits of continuing Xarelto for secondary prevention The patient will think about it

## 2023-03-01 NOTE — Assessment & Plan Note (Signed)
Repeat ultrasound showed no evidence of DVT D-dimer is negative We discussed risk and benefits of pursuing thrombophilia workup We discussed risk and benefits of secondary prevention with low-dose Xarelto versus aspirin therapy Ultimately, the patient will continue her Xarelto at reduced dose until her prescription runs out If she is interested to pursue thrombophilia workup, she will call me and let me know At this point in time, she stated she had multiple appointments with different specialties and would like to hold off additional test Previously, I identify reduced oral fluid intake and sedentary lifestyle as potential risk factors and the patient is attempting to drink more liquids and increase physical activity

## 2023-03-16 ENCOUNTER — Encounter: Payer: Self-pay | Admitting: Adult Health

## 2023-03-16 ENCOUNTER — Ambulatory Visit: Payer: 59 | Admitting: Adult Health

## 2023-03-16 VITALS — BP 98/60 | HR 109 | Temp 98.6°F | Ht 61.0 in | Wt 177.0 lb

## 2023-03-16 DIAGNOSIS — J309 Allergic rhinitis, unspecified: Secondary | ICD-10-CM | POA: Diagnosis not present

## 2023-03-16 DIAGNOSIS — J4530 Mild persistent asthma, uncomplicated: Secondary | ICD-10-CM

## 2023-03-16 DIAGNOSIS — I2699 Other pulmonary embolism without acute cor pulmonale: Secondary | ICD-10-CM

## 2023-03-16 NOTE — Patient Instructions (Addendum)
Continue on Xarelto 10mg  daily until done (as planned per Hematology) Avoid NSAIDs,(No Advil, motrin , aleve, ibuprofen , etc) .  Follow up with Hematology as planned.   Continue on Trelegy 1 puff daily  Albuterol inhaler As needed  use with spacer .  Zyrtec 10mg  daily As needed  drainage.   Follow up with in 6 months and As needed

## 2023-03-16 NOTE — Assessment & Plan Note (Signed)
PE and Right DVT  most like provoked from immobility and dehydration - completed 6 month course of Xarelto . Has seen Hematology . Repeat Doppler is negative for DVT and D Dimer is neg. She is going to finish current rx . Then consider labs for thrombocytophilia .   Plan  Patient Instructions  Continue on Xarelto 10mg  daily until done (as planned per Hematology) Avoid NSAIDs,(No Advil, motrin , aleve, ibuprofen , etc) .  Follow up with Hematology as planned.   Continue on Trelegy 1 puff daily  Albuterol inhaler As needed  use with spacer .  Zyrtec 10mg  daily As needed  drainage.   Follow up with in 6 months and As needed

## 2023-03-16 NOTE — Assessment & Plan Note (Signed)
Well controlled on rx. Trelegy is covered well. Consider change/deescalate to [facility] on return If still doing well   Plan  Patient Instructions  Continue on Xarelto 10mg  daily until done (as planned per Hematology) Avoid NSAIDs,(No Advil, motrin , aleve, ibuprofen , etc) .  Follow up with Hematology as planned.   Continue on Trelegy 1 puff daily  Albuterol inhaler As needed  use with spacer .  Zyrtec 10mg  daily As needed  drainage.   Follow up with in 6 months and As needed

## 2023-03-16 NOTE — Assessment & Plan Note (Signed)
Zyrtec daily as needed

## 2023-03-16 NOTE — Progress Notes (Signed)
@Patient  ID: Christy [name], female    DOB: 03-Aug-1960, 62 y.o.   MRN: [id number]  Chief Complaint  Patient presents with   Follow-up    Referring provider: [name], NP  HPI: 62 year old female never smoker seen for pulmonary consult February 2024 for shortness of breath found to have PE and DVT.  Along with asthma Medical history significant for stable pulmonary nodule on serial CT consistent with a benign etiology From [place].    TEST/EVENTS :  CT chest was ordered last visit and completed on August 21, 2022. This showed a small acute nonocclusive subsegmental pulmonary embolism. In the right lower lobe. An unchanged mixed solid and subsolid nodule in the left upper lobe dating back to 2018 consistent with a benign etiology.    Venous Doppler showed a right lower extremity DVT.    2D echo October 04, 2022 showed EF at 60 to 65%, grade 1 diastolic dysfunction, normal pulmonary artery systolic pressure   pulmonary function testing that shows moderate airflow obstruction and restriction. And significant reversibility. Prebronchodilator FEV1 was 65%, ratio 70, FVC 71%, postbronchodilator FEV1 82%, ratio 74, FVC 85%, 26% bronchodilator change, diffusing capacity 106%.    FH : Positive for VTE and Lupus (2 kids and grandaughter)  03/16/2023 Follow up : PE and Asthma  Patient presents for a 60-month follow-up.  She has a history of pulmonary embolism.  CT chest August 21, 2022 showed small acute nonocclusive segmental PE.  She was started on Xarelto.  Has been seen by hematology.  Felt PE and DVT were most likely provoked.  She completed a 6 months course of anticoagulation therapy.  Repeat ultrasound showed no evidence of DVT.  D-dimer was negative. She has decided to finish her current supply and will stop this.   ? Breo when she comes back     Allergies  Allergen Reactions   Penicillins Hives    Immunization History  Administered Date(s) Administered   Influenza-Unspecified  03/11/2020, 03/28/2021   PFIZER(Purple Top)SARS-COV-2 Vaccination 09/20/2019, 10/14/2019, 05/22/2020    Past Medical History:  Diagnosis Date   Allergy    DVT (deep venous thrombosis) (HCC)    Migraine aura without headache 1998   PE (pulmonary thromboembolism) (HCC)     Tobacco History: Social History   Tobacco Use  Smoking Status Never   Passive exposure: Current  Smokeless Tobacco Never   Counseling given: Not Answered   Outpatient Medications Prior to Visit  Medication Sig Dispense Refill   albuterol (VENTOLIN HFA) 108 (90 Base) MCG/ACT inhaler Inhale 2 puffs into the lungs as needed for wheezing or shortness of breath.     cetirizine (ZYRTEC) 10 MG tablet Take 10 mg by mouth as needed.     diphenhydrAMINE-APAP, sleep, (TYLENOL PM EXTRA STRENGTH PO) Take by mouth.     famotidine (PEPCID) 20 MG tablet One after supper (Patient taking differently: Take 20 mg by mouth as needed. One after supper) 30 tablet 11   Fluticasone-Umeclidin-Vilant (TRELEGY ELLIPTA) 200-62.5-25 MCG/ACT AEPB Inhale 1 puff into the lungs daily at 6 (six) AM. 1 each 5   pantoprazole (PROTONIX) 40 MG tablet TAKE 1 TABLET (40 MG TOTAL) BY MOUTH DAILY. TAKE 30-60 MIN BEFORE FIRST MEAL OF THE DAY 30 tablet 2   rivaroxaban (XARELTO) 10 MG TABS tablet Take 1 tablet (10 mg total) by mouth daily with supper.     Spacer/Aero-Hold Chamber Bags MISC Use spacer with albuterol and Advair inhalers. 1 each 0   VITAMIN D3 10 MCG (  400 UNIT) tablet Take 800 Units by mouth daily.     No facility-administered medications prior to visit.     Review of Systems:   Constitutional:   No  weight loss, night sweats,  Fevers, chills, fatigue, or  lassitude.  HEENT:   No headaches,  Difficulty swallowing,  Tooth/dental problems, or  Sore throat,                No sneezing, itching, ear ache, nasal congestion, post nasal drip,   CV:  No chest pain,  Orthopnea, PND, swelling in lower extremities, anasarca, dizziness,  palpitations, syncope.   GI  No heartburn, indigestion, abdominal pain, nausea, vomiting, diarrhea, change in bowel habits, loss of appetite, bloody stools.   Resp: No shortness of breath with exertion or at rest.  No excess mucus, no productive cough,  No non-productive cough,  No coughing up of blood.  No change in color of mucus.  No wheezing.  No chest wall deformity  Skin: no rash or lesions.  GU: no dysuria, change in color of urine, no urgency or frequency.  No flank pain, no hematuria   MS:  No joint pain or swelling.  No decreased range of motion.  No back pain.    Physical Exam  BP 98/60 (BP Location: Right Arm, Patient Position: Sitting, Cuff Size: Normal)   Pulse (!) 109   Temp 98.6 F (37 C) (Oral)   Ht 5\' 1"  (1.549 m)   Wt 177 lb (80.3 kg)   LMP 05/10/2011   SpO2 98%   BMI 33.44 kg/m   GEN: A/Ox3; pleasant , NAD, well nourished    HEENT:  [place]/AT,  EACs-clear, TMs-wnl, NOSE-clear, THROAT-clear, no lesions, no postnasal drip or exudate noted.   NECK:  Supple w/ fair ROM; no JVD; normal carotid impulses w/o bruits; no thyromegaly or nodules palpated; no lymphadenopathy.    RESP  Clear  P & A; w/o, wheezes/ rales/ or rhonchi. no accessory muscle use, no dullness to percussion  CARD:  RRR, no m/r/g, no peripheral edema, pulses intact, no cyanosis or clubbing.  GI:   Soft & nt; nml bowel sounds; no organomegaly or masses detected.   Musco: Warm bil, no deformities or joint swelling noted.   Neuro: alert, no focal deficits noted.    Skin: Warm, no lesions or rashes    Lab Results:  CBC   BNP No results found for: "BNP"  ProBNP No results found for: "PROBNP"  Imaging: LONG TERM MONITOR (3-14 DAYS)  Result Date: 03/07/2023 Patch Wear Time:  13 days and 16 hours (2024-08-23T18:45:56-0400 to 2024-09-06T10:52:09-398) Patient had a min HR of 57 bpm (sinus bradycardia), max HR of 146 bpm (sinus tachycardia), and avg HR of 83 bpm (normal sinus rhythm).  Predominant underlying rhythm was Sinus Rhythm. Isolated SVEs were rare (<1.0%), SVE Couplets were rare (<1.0%), and SVE Triplets were rare (<1.0%). Isolated VEs were rare (<1.0%), and no VE Couplets or VE Triplets were present. Impression: No arrhythmias detected. Rare ectopy. Gerri Spore T. Flora Lipps, MD, [facility]  [facility] [address] [place], [place] [address] [phone] 8:30 PM  VAS [place] LOWER EXTREMITY VENOUS (DVT)  Result Date: 02/23/2023  Lower Venous DVT Study Patient Name:  PICCOLA ARICO  Date of Exam:   02/22/2023 Medical Rec #: [id number]       Accession #:    [id number] Date of Birth: 11/25/1960        Patient Gender: F Patient Age:  62 years Exam Location[facility]  [facility] Procedure:      VAS [place] LOWER EXTREMITY VENOUS (DVT) Referring Phys: NI GORSUCH --------------------------------------------------------------------------------  Indications: F/U DVT found in right posterior tibial vein 08/22/2022.  Risk Factors: PE and DVT 08/2022. Anticoagulation: Xarelto. Comparison Study: Prior study done 08/2022 Performing Technologist: Sherren Kerns RVS  Examination Guidelines: A complete evaluation includes B-mode imaging, spectral Doppler, color Doppler, and power Doppler as needed of all accessible portions of each vessel. Bilateral testing is considered an integral part of a complete examination. Limited examinations for reoccurring indications may be performed as noted. The reflux portion of the exam is performed with the patient in reverse Trendelenburg.  +---------+---------------+---------+-----------+----------+--------------+ RIGHT    CompressibilityPhasicitySpontaneityPropertiesThrombus Aging +---------+---------------+---------+-----------+----------+--------------+ CFV      Full           Yes      Yes                                 +---------+---------------+---------+-----------+----------+--------------+ SFJ      Full                                                         +---------+---------------+---------+-----------+----------+--------------+ FV Prox  Full                                                        +---------+---------------+---------+-----------+----------+--------------+ FV Mid   Full                                                        +---------+---------------+---------+-----------+----------+--------------+ FV DistalFull                                                        +---------+---------------+---------+-----------+----------+--------------+ PFV      Full                                                        +---------+---------------+---------+-----------+----------+--------------+ POP      Full           Yes      Yes                                 +---------+---------------+---------+-----------+----------+--------------+ PTV      Full                                                        +---------+---------------+---------+-----------+----------+--------------+  PERO     Full                                                        +---------+---------------+---------+-----------+----------+--------------+   +----+---------------+---------+-----------+----------+--------------+ LEFTCompressibilityPhasicitySpontaneityPropertiesThrombus Aging +----+---------------+---------+-----------+----------+--------------+ CFV Full           Yes      Yes                                 +----+---------------+---------+-----------+----------+--------------+    Summary: RIGHT: - No evidence of deep vein thrombosis in the lower extremity. No indirect evidence of obstruction proximal to the inguinal ligament.  - Findings suggest resolution of previously noted thrombus. - No cystic structure found in the popliteal fossa.  LEFT: - No evidence of common femoral vein obstruction.   *See table(s) above for measurements and observations. Electronically signed by Gerarda Fraction on 02/23/2023 at  8:43:24 AM.    Final     Administration History     None          Latest Ref Rng & Units 11/02/2022    2:41 PM  PFT Results  FVC-Pre L 2.04   FVC-Predicted Pre % 71   FVC-Post L 2.43   FVC-Predicted Post % 85   Pre FEV1/FVC % % 70   Post FEV1/FCV % % 74   FEV1-Pre L 1.43   FEV1-Predicted Pre % 65   FEV1-Post L 1.81   DLCO uncorrected ml/min/mmHg 19.13   DLCO UNC% % 106   DLCO corrected ml/min/mmHg 19.13   DLCO COR %Predicted % 106   DLVA Predicted % 115   TLC L 4.42   TLC % Predicted % 96   RV % Predicted % 140     No results found for: "NITRICOXIDE"      Assessment & Plan:   Pulmonary embolism (HCC) PE and Right DVT  most like provoked from immobility and dehydration - completed 6 month course of Xarelto . Has seen Hematology . Repeat Doppler is negative for DVT and D Dimer is neg. She is going to finish current rx . Then consider labs for thrombocytophilia .   Plan  Patient Instructions  Continue on Xarelto 10mg  daily until done (as planned per Hematology) Avoid NSAIDs,(No Advil, motrin , aleve, ibuprofen , etc) .  Follow up with Hematology as planned.   Continue on Trelegy 1 puff daily  Albuterol inhaler As needed  use with spacer .  Zyrtec 10mg  daily As needed  drainage.   Follow up with in 6 months and As needed      Asthma Well controlled on rx. Trelegy is covered well. Consider change/deescalate to [facility] on return If still doing well   Plan  Patient Instructions  Continue on Xarelto 10mg  daily until done (as planned per Hematology) Avoid NSAIDs,(No Advil, motrin , aleve, ibuprofen , etc) .  Follow up with Hematology as planned.   Continue on Trelegy 1 puff daily  Albuterol inhaler As needed  use with spacer .  Zyrtec 10mg  daily As needed  drainage.   Follow up with in 6 months and As needed      Allergic rhinitis Zyrtec daily as needed.      [name], NP 03/16/2023

## 2023-05-06 DIAGNOSIS — E669 Obesity, unspecified: Secondary | ICD-10-CM | POA: Diagnosis not present

## 2023-05-06 DIAGNOSIS — Z8249 Family history of ischemic heart disease and other diseases of the circulatory system: Secondary | ICD-10-CM | POA: Diagnosis not present

## 2023-05-06 DIAGNOSIS — Z86711 Personal history of pulmonary embolism: Secondary | ICD-10-CM | POA: Diagnosis not present

## 2023-05-06 DIAGNOSIS — Z6833 Body mass index (BMI) 33.0-33.9, adult: Secondary | ICD-10-CM | POA: Diagnosis not present

## 2023-05-06 DIAGNOSIS — Z809 Family history of malignant neoplasm, unspecified: Secondary | ICD-10-CM | POA: Diagnosis not present

## 2023-05-06 DIAGNOSIS — J45909 Unspecified asthma, uncomplicated: Secondary | ICD-10-CM | POA: Diagnosis not present

## 2023-05-06 DIAGNOSIS — Z833 Family history of diabetes mellitus: Secondary | ICD-10-CM | POA: Diagnosis not present

## 2023-05-06 DIAGNOSIS — Z86718 Personal history of other venous thrombosis and embolism: Secondary | ICD-10-CM | POA: Diagnosis not present

## 2023-05-06 DIAGNOSIS — Z7901 Long term (current) use of anticoagulants: Secondary | ICD-10-CM | POA: Diagnosis not present

## 2023-05-06 DIAGNOSIS — R030 Elevated blood-pressure reading, without diagnosis of hypertension: Secondary | ICD-10-CM | POA: Diagnosis not present

## 2023-05-06 DIAGNOSIS — Z008 Encounter for other general examination: Secondary | ICD-10-CM | POA: Diagnosis not present

## 2023-05-06 DIAGNOSIS — Z823 Family history of stroke: Secondary | ICD-10-CM | POA: Diagnosis not present

## 2023-10-15 DIAGNOSIS — M25511 Pain in right shoulder: Secondary | ICD-10-CM | POA: Diagnosis not present

## 2023-10-15 DIAGNOSIS — W19XXXA Unspecified fall, initial encounter: Secondary | ICD-10-CM | POA: Diagnosis not present

## 2023-10-15 DIAGNOSIS — S43401A Unspecified sprain of right shoulder joint, initial encounter: Secondary | ICD-10-CM | POA: Diagnosis not present

## 2023-11-07 DIAGNOSIS — J4489 Other specified chronic obstructive pulmonary disease: Secondary | ICD-10-CM | POA: Diagnosis not present

## 2023-11-07 DIAGNOSIS — Z8249 Family history of ischemic heart disease and other diseases of the circulatory system: Secondary | ICD-10-CM | POA: Diagnosis not present

## 2023-11-07 DIAGNOSIS — Z809 Family history of malignant neoplasm, unspecified: Secondary | ICD-10-CM | POA: Diagnosis not present

## 2023-11-07 DIAGNOSIS — Z7901 Long term (current) use of anticoagulants: Secondary | ICD-10-CM | POA: Diagnosis not present

## 2023-11-07 DIAGNOSIS — Z823 Family history of stroke: Secondary | ICD-10-CM | POA: Diagnosis not present

## 2023-11-07 DIAGNOSIS — Z86718 Personal history of other venous thrombosis and embolism: Secondary | ICD-10-CM | POA: Diagnosis not present

## 2023-11-07 DIAGNOSIS — J309 Allergic rhinitis, unspecified: Secondary | ICD-10-CM | POA: Diagnosis not present

## 2023-11-07 DIAGNOSIS — Z833 Family history of diabetes mellitus: Secondary | ICD-10-CM | POA: Diagnosis not present

## 2023-11-07 DIAGNOSIS — M1990 Unspecified osteoarthritis, unspecified site: Secondary | ICD-10-CM | POA: Diagnosis not present

## 2023-11-07 DIAGNOSIS — E669 Obesity, unspecified: Secondary | ICD-10-CM | POA: Diagnosis not present

## 2023-11-07 DIAGNOSIS — Z6833 Body mass index (BMI) 33.0-33.9, adult: Secondary | ICD-10-CM | POA: Diagnosis not present

## 2023-11-07 DIAGNOSIS — Z7951 Long term (current) use of inhaled steroids: Secondary | ICD-10-CM | POA: Diagnosis not present

## 2024-01-10 ENCOUNTER — Encounter: Payer: Self-pay | Admitting: Adult Health

## 2024-01-10 ENCOUNTER — Ambulatory Visit (INDEPENDENT_AMBULATORY_CARE_PROVIDER_SITE_OTHER): Payer: Self-pay | Admitting: Adult Health

## 2024-01-10 VITALS — BP 126/78 | HR 91 | Temp 98.1°F | Ht 61.0 in | Wt 170.0 lb

## 2024-01-10 DIAGNOSIS — R0683 Snoring: Secondary | ICD-10-CM | POA: Diagnosis not present

## 2024-01-10 DIAGNOSIS — R55 Syncope and collapse: Secondary | ICD-10-CM

## 2024-01-10 MED ORDER — TRELEGY ELLIPTA 200-62.5-25 MCG/ACT IN AEPB: 1.0000 | INHALATION_SPRAY | Freq: Every day | RESPIRATORY_TRACT | 5 refills | Status: AC

## 2024-01-10 NOTE — Progress Notes (Signed)
 @Patient  ID: Christy [name], female    DOB: 22-Feb-1961, 63 y.o.   MRN: [id number]  Chief Complaint  Patient presents with   Follow-up    SOB only on exertion and dizziness when she laughs. She 'dozes off' when she laughs and dozes to the right.     Referring provider: Kip Ade, NP  HPI: 63 year old female never smoker seen for pulmonary consult February 2024 for shortness of breath found to have Asthma and PE and DVT  -completed 6 month course of Xatelto   Medical history significant for stable pulmonary nodule followed on serial CT chest consistent with benign etiology From [place].   TEST/EVENTS :  CT chest was ordered last visit and completed on August 21, 2022. This showed a small acute nonocclusive subsegmental pulmonary embolism. In the right lower lobe. An unchanged mixed solid and subsolid nodule in the left upper lobe dating back to 2018 consistent with a benign etiology.    Venous Doppler showed a right lower extremity DVT.    2D echo October 04, 2022 showed EF at 60 to 65%, grade 1 diastolic dysfunction, normal pulmonary artery systolic pressure   pulmonary function testing that shows moderate airflow obstruction and restriction. And significant reversibility. Prebronchodilator FEV1 was 65%, ratio 70, FVC 71%, postbronchodilator FEV1 82%, ratio 74, FVC 85%, 26% bronchodilator change, diffusing capacity 106%.    FH : Positive for VTE and Lupus (2 kids and grandaughter)     01/10/2024 Follow up: PE and Asthma  Discussed the use of AI scribe software for clinical note transcription with the patient, who gave verbal consent to proceed.  History of Present Illness Christy [name] is a 63 year old female who presents with episodes of near syncope associated with laughter.  She has been off her asthma medication, Trelegy, for three months due to running out of refills. Trelegy was previously effective in managing her asthma symptoms.  She describes episodes of near  syncope occurring during laughter, feeling lightheaded and as if she might pass out. These episodes have been present since stopping Trelegy, with one instance of slumping over in a chair while laughing. The episodes are brief, lasting up to five seconds, and occur exclusively during laughter or extreme excitement.  She has history of palpitations, with her heart rate reaching over 100 beats per minute at rest, as indicated by her watch. No known heart problems and a previous evaluation by a cardiologist, Dr. Vernice, found no abnormalities. An echocardiogram and EKG were normal, and a 14-day Zio patch showed no arrhythmias.  She reports poor sleep, often going to bed around 2:30 to 3:00 AM and waking up at 7:00 AM. Says she never sleeps more than a couple of hours at a time. She experiences upper airway noise during sleep, described as a 'weird sound like violins,' but denies significant snoring. She has not undergone a sleep study before. There is a family history of sleep apnea, as her sister was diagnosed and required treatment.  She is currently not on any medications except for Zyrtec as needed for allergies.     Allergies  Allergen Reactions   Penicillins Hives    Immunization History  Administered Date(s) Administered   Influenza-Unspecified 03/11/2020, 03/28/2021   PFIZER(Purple Top)SARS-COV-2 Vaccination 09/20/2019, 10/14/2019, 05/22/2020    Past Medical History:  Diagnosis Date   Allergy    DVT (deep venous thrombosis) (HCC)    Migraine aura without headache 1998   PE (pulmonary thromboembolism) (HCC)     Tobacco History:  Social History   Tobacco Use  Smoking Status Never   Passive exposure: Current  Smokeless Tobacco Never   Counseling given: Not Answered   Outpatient Medications Prior to Visit  Medication Sig Dispense Refill   albuterol (VENTOLIN HFA) 108 (90 Base) MCG/ACT inhaler Inhale 2 puffs into the lungs as needed for wheezing or shortness of breath.      cetirizine (ZYRTEC) 10 MG tablet Take 10 mg by mouth as needed.     diphenhydrAMINE-APAP, sleep, (TYLENOL PM EXTRA STRENGTH PO) Take by mouth.     famotidine  (PEPCID ) 20 MG tablet One after supper (Patient taking differently: Take 20 mg by mouth as needed. One after supper) 30 tablet 11   Fluticasone -Umeclidin-Vilant (TRELEGY ELLIPTA ) 200-62.5-25 MCG/ACT AEPB Inhale 1 puff into the lungs daily at 6 (six) AM. 1 each 5   pantoprazole  (PROTONIX ) 40 MG tablet TAKE 1 TABLET (40 MG TOTAL) BY MOUTH DAILY. TAKE 30-60 MIN BEFORE FIRST MEAL OF THE DAY 30 tablet 2   rivaroxaban  (XARELTO ) 10 MG TABS tablet Take 1 tablet (10 mg total) by mouth daily with supper.     Spacer/Aero-Hold Chamber Bags MISC Use spacer with albuterol and Advair inhalers. 1 each 0   VITAMIN D3 10 MCG (400 UNIT) tablet Take 800 Units by mouth daily.     No facility-administered medications prior to visit.     Review of Systems:   Constitutional:   No  weight loss, night sweats,  Fevers, chills, +fatigue, or  lassitude.  HEENT:   No headaches,  Difficulty swallowing,  Tooth/dental problems, or  Sore throat,                No sneezing, itching, ear ache, nasal congestion, post nasal drip,   CV:  No chest pain,  Orthopnea, PND, swelling in lower extremities, anasarca, dizziness, palpitations, syncope.   GI  No heartburn, indigestion, abdominal pain, nausea, vomiting, diarrhea, change in bowel habits, loss of appetite, bloody stools.   Resp: No shortness of breath with exertion or at rest.  No excess mucus, no productive cough,  No non-productive cough,  No coughing up of blood.  No change in color of mucus.  No wheezing.  No chest wall deformity  Skin: no rash or lesions.  GU: no dysuria, change in color of urine, no urgency or frequency.  No flank pain, no hematuria   MS:  No joint pain or swelling.  No decreased range of motion.  No back pain.    Physical Exam  BP 126/78 (BP Location: Left Arm, Patient Position: Sitting,  Cuff Size: Normal)   Pulse 91   Temp 98.1 F (36.7 C) (Oral)   Ht 5' 1 (1.549 m)   Wt 170 lb (77.1 kg)   LMP 05/10/2011   SpO2 97%   BMI 32.12 kg/m   GEN: A/Ox3; pleasant , NAD, well nourished    HEENT:  [place]/AT,  NOSE-clear, THROAT-clear, no lesions, no postnasal drip or exudate noted.   NECK:  Supple w/ fair ROM; no JVD; normal carotid impulses w/o bruits; no thyromegaly or nodules palpated; no lymphadenopathy.    RESP  Clear  P & A; w/o, wheezes/ rales/ or rhonchi. no accessory muscle use, no dullness to percussion  CARD:  RRR, no m/r/g, no peripheral edema, pulses intact, no cyanosis or clubbing.  GI:   Soft & nt; nml bowel sounds; no organomegaly or masses detected.   Musco: Warm bil, no deformities or joint swelling noted.   Neuro: alert, no  focal deficits noted.    Skin: Warm, no lesions or rashes      BMET   BNP No results found for: BNP  ProBNP No results found for: PROBNP  Imaging: No results found.  Administration History     None          Latest Ref Rng & Units 11/02/2022    2:41 PM  PFT Results  FVC-Pre L 2.04   FVC-Predicted Pre % 71   FVC-Post L 2.43   FVC-Predicted Post % 85   Pre FEV1/FVC % % 70   Post FEV1/FCV % % 74   FEV1-Pre L 1.43   FEV1-Predicted Pre % 65   FEV1-Post L 1.81   DLCO uncorrected ml/min/mmHg 19.13   DLCO UNC% % 106   DLCO corrected ml/min/mmHg 19.13   DLCO COR %Predicted % 106   DLVA Predicted % 115   TLC L 4.42   TLC % Predicted % 96   RV % Predicted % 140     No results found for: NITRICOXIDE      Assessment & Plan:   No problem-specific Assessment & Plan notes found for this encounter.  Assessment and Plan Assessment & Plan Asthma  -increased symptom burden off maintenance inhaler .  Asthma symptoms are well-managed with Trelegy, but she has been off the medication for three months. She reports lightheadedness and near syncope when laughing, possibly due to bronchospasm. Restart Trelegy,  one puff daily, and ensure to rinse mouth after use to prevent oral side effects. Send prescription to [facility] on [address]. Instruct her to notify if refills are needed in the future.  Sleep disorder  - She reports poor sleep patterns with insufficient sleep duration and upper airway noise during sleep, suggesting possible sleep apnea. There is a family history of sleep apnea. Lack of REM sleep may contribute to symptoms, including episodes of near syncope when laughing. Order a home sleep study to evaluate for sleep apnea. Advise on establishing a healthy sleep regimen, including going to bed at a reasonable time and getting 6-7 hours of sleep. Consider an in-lab sleep study if the home study is inconclusive.  Palpitations   She reports episodes of palpitations with heart rates over 100 bpm at rest. Previous cardiac evaluation was normal. Symptoms may be related to sleep disorder or other non-cardiac causes. Monitor symptoms and reassess in six weeks after restarting Trelegy and addressing sleep issues.    Near syncope with laughter- new onset . ? Cataplexy. Check sleep study for possible sleep apnea. Needs improved sleep hygiene with chronic insomnia. Maximize asthma maintenance . If still occurring Consider referral to a neurologist if symptoms persist despite treatment.     I spent 42   minutes dedicated to the care of this patient on the date of this encounter to include pre-visit review of records, face-to-face time with the patient discussing conditions above, post visit ordering of testing, clinical documentation with the electronic health record, making appropriate referrals as documented, and communicating necessary findings to members of the patients care team.   Madelin Stank, NP 01/10/2024

## 2024-01-10 NOTE — Patient Instructions (Addendum)
 Restart Trelegy 1 puff daily, rinse after use.  Albuterol inhaler As needed  use with spacer .  Zyrtec 10mg  daily As needed  drainage.   Set up home sleep study.  Healthy sleep regimen.  Activity as tolerated.   If these episodes of near passing out with laughter increase or worsen please call back for sooner follow up . Return for labs.   Follow up with in 6 weeks and As needed    Please contact office for sooner follow up if symptoms do not improve or worsen or seek emergency care

## 2024-01-11 ENCOUNTER — Telehealth: Payer: Self-pay

## 2024-01-11 ENCOUNTER — Other Ambulatory Visit (HOSPITAL_COMMUNITY): Payer: Self-pay

## 2024-01-11 NOTE — Telephone Encounter (Signed)
 Copied from CRM [id number]. Topic: Clinical - Prescription Issue >> Jan 10, 2024  4:40 PM Rozanna MATSU wrote: Reason for CRM: PT CALLED STATED MEDICAITION FOR Fluticasone -Umeclidin-Vilant (TRELEGY ELLIPTA ) 200-62.5-25 MCG/ACT AEPB WAS $653, STATED SHE WAS GETTING IT LAST YEAR FOR $20 DOLLARS. WANTS TO KNOW WHAT SHE NEEDS TO DO TO GO BACK TO THE $20 FOR THE MEDICINE.   Can you please look into this and see if there is an option for lower prices?

## 2024-01-11 NOTE — Telephone Encounter (Signed)
 Unable to do benefits investigation due to the medication currently being filled at patients pharmacy. Patient most likely has to meet a deductible before pricing goes down.

## 2024-01-15 ENCOUNTER — Other Ambulatory Visit

## 2024-01-15 LAB — CBC WITH DIFFERENTIAL/PLATELET
Basophils Absolute: 0.1 K/uL (ref 0.0–0.1)
Basophils Relative: 0.8 % (ref 0.0–3.0)
Eosinophils Absolute: 0.4 K/uL (ref 0.0–0.7)
Eosinophils Relative: 6.5 % — ABNORMAL HIGH (ref 0.0–5.0)
HCT: 36.7 % (ref 36.0–46.0)
Hemoglobin: 11.8 g/dL — ABNORMAL LOW (ref 12.0–15.0)
Lymphocytes Relative: 41 % (ref 12.0–46.0)
Lymphs Abs: 2.7 K/uL (ref 0.7–4.0)
MCHC: 32.1 g/dL (ref 30.0–36.0)
MCV: 76.9 fl — ABNORMAL LOW (ref 78.0–100.0)
Monocytes Absolute: 0.4 K/uL (ref 0.1–1.0)
Monocytes Relative: 6.7 % (ref 3.0–12.0)
Neutro Abs: 3 K/uL (ref 1.4–7.7)
Neutrophils Relative %: 45 % (ref 43.0–77.0)
Platelets: 417 K/uL — ABNORMAL HIGH (ref 150.0–400.0)
RBC: 4.77 Mil/uL (ref 3.87–5.11)
RDW: 14.9 % (ref 11.5–15.5)
WBC: 6.6 K/uL (ref 4.0–10.5)

## 2024-01-15 LAB — COMPREHENSIVE METABOLIC PANEL WITH GFR
ALT: 12 U/L (ref 0–35)
AST: 14 U/L (ref 0–37)
Albumin: 4 g/dL (ref 3.5–5.2)
Alkaline Phosphatase: 57 U/L (ref 39–117)
BUN: 12 mg/dL (ref 6–23)
CO2: 26 meq/L (ref 19–32)
Calcium: 9.4 mg/dL (ref 8.4–10.5)
Chloride: 106 meq/L (ref 96–112)
Creatinine, Ser: 0.98 mg/dL (ref 0.40–1.20)
GFR: 60.83 mL/min (ref >60.00)
Glucose, Bld: 101 mg/dL — ABNORMAL HIGH (ref 70–99)
Potassium: 3.9 meq/L (ref 3.5–5.1)
Sodium: 141 meq/L (ref 135–145)
Total Bilirubin: 0.3 mg/dL (ref 0.2–1.2)
Total Protein: 6.9 g/dL (ref 6.0–8.3)

## 2024-01-15 LAB — TSH: TSH: 1.14 u[IU]/mL (ref 0.35–5.50)

## 2024-01-15 LAB — MAGNESIUM: Magnesium: 1.7 mg/dL (ref 1.5–2.5)

## 2024-01-17 ENCOUNTER — Ambulatory Visit: Payer: Self-pay | Admitting: Adult Health

## 2024-01-18 ENCOUNTER — Telehealth: Payer: Self-pay

## 2024-01-18 NOTE — Telephone Encounter (Signed)
 Copied from CRM [id number]. Topic: Clinical - Prescription Issue >> Jan 18, 2024 11:49 AM Christy [name] wrote: Reason for CRM: Patient states Trelegy is costing [phone]   Patient would like to know if theres any coupons she can use to get [name] discounted price.   Callback number: [phone]   Spoke with patient explained it more than likely [name] deducible she has to meet... due to the cost being so high.   PT Stated will reach out to insurance    NFN-

## 2024-01-22 DIAGNOSIS — R55 Syncope and collapse: Secondary | ICD-10-CM

## 2024-01-22 DIAGNOSIS — R0683 Snoring: Secondary | ICD-10-CM

## 2024-02-01 DIAGNOSIS — G4733 Obstructive sleep apnea (adult) (pediatric): Secondary | ICD-10-CM

## 2024-02-20 DIAGNOSIS — D649 Anemia, unspecified: Secondary | ICD-10-CM | POA: Diagnosis not present

## 2024-02-20 DIAGNOSIS — L040 Acute lymphadenitis of face, head and neck: Secondary | ICD-10-CM | POA: Diagnosis not present

## 2024-02-22 DIAGNOSIS — I889 Nonspecific lymphadenitis, unspecified: Secondary | ICD-10-CM | POA: Diagnosis not present

## 2024-02-27 DIAGNOSIS — L821 Other seborrheic keratosis: Secondary | ICD-10-CM | POA: Diagnosis not present

## 2024-02-27 DIAGNOSIS — L811 Chloasma: Secondary | ICD-10-CM | POA: Diagnosis not present

## 2024-02-27 DIAGNOSIS — L608 Other nail disorders: Secondary | ICD-10-CM | POA: Diagnosis not present

## 2024-02-27 DIAGNOSIS — D229 Melanocytic nevi, unspecified: Secondary | ICD-10-CM | POA: Diagnosis not present

## 2024-03-10 ENCOUNTER — Ambulatory Visit (INDEPENDENT_AMBULATORY_CARE_PROVIDER_SITE_OTHER): Admitting: Adult Health

## 2024-03-10 ENCOUNTER — Encounter: Payer: Self-pay | Admitting: Adult Health

## 2024-03-10 VITALS — BP 117/70 | HR 83 | Temp 98.4°F | Ht 61.0 in | Wt 172.4 lb

## 2024-03-10 DIAGNOSIS — G4733 Obstructive sleep apnea (adult) (pediatric): Secondary | ICD-10-CM

## 2024-03-10 DIAGNOSIS — J4540 Moderate persistent asthma, uncomplicated: Secondary | ICD-10-CM

## 2024-03-10 DIAGNOSIS — R590 Localized enlarged lymph nodes: Secondary | ICD-10-CM | POA: Diagnosis not present

## 2024-03-10 NOTE — Progress Notes (Unsigned)
 @Patient  ID: Christy [name], female    DOB: 07/05/1960, 63 y.o.   MRN: [id number]  Chief Complaint  Patient presents with   Obstructive Sleep Apnea    Follow up home sleep test     Referring provider: Kip Ade, NP  HPI: 63 year old female never smoker seen for pulmonary consult February 2024 for shortness of breath found to have asthma and PE and DVT completed 43-month course of Xarelto  Medical history significant for stable pulmonary nodule followed on serial CT chest consistent with benign etiology From [place].   TEST/EVENTS :  CT chest was ordered last visit and completed on August 21, 2022. This showed a small acute nonocclusive subsegmental pulmonary embolism. In the right lower lobe. An unchanged mixed solid and subsolid nodule in the left upper lobe dating back to 2018 consistent with a benign etiology.    Venous Doppler showed a right lower extremity DVT.    2D echo October 04, 2022 showed EF at 60 to 65%, grade 1 diastolic dysfunction, normal pulmonary artery systolic pressure   pulmonary function testing that shows moderate airflow obstruction and restriction. And significant reversibility. Prebronchodilator FEV1 was 65%, ratio 70, FVC 71%, postbronchodilator FEV1 82%, ratio 74, FVC 85%, 26% bronchodilator change, diffusing capacity 106%.    FH : Positive for VTE and Lupus (2 kids and grandaughter)    03/10/2024 Follow up : Asthma Discussed the use of AI scribe software for clinical note transcription with the patient, who gave verbal consent to proceed.  History of Present Illness Christy [name] is a 63 year old female who presents with episodes of near syncope and sleep apnea.  She experiences episodes of near syncope accompanied by palpitations and lightheadedness, occurring exclusively during laughter or extreme excitement. These episodes are brief, lasting up to five seconds, and began after discontinuing her Trelegy maintenance regimen for asthma. Upon restarting  Trelegy, these episodes resolved. She has a history of palpitations and was previously evaluated by cardiology, which did not reveal any significant findings.  She visited urgent care due to facial swelling and pain, which was evaluated with an ultrasound that returned normal results. She has not yet been contacted for a follow-up appointment with ENT. No symptoms of infection such as sore throat or ear pain, but she reports significant pain in the jaw and headache.  She reports difficulty sleeping, characterized by going to bed late, waking up after a few hours, and never sleeping more than a couple of hours at a time. A home sleep study conducted on January 22, 2024, revealed moderate sleep apnea with an AHI of 21.3/hour and a low SpO2 of 83%.  She is currently taking Trelegy for asthma. She also takes allergy medication. She was previously on Xarelto , which she stopped on her own after a discussion with hematology, where her D-dimer was found to be normal.     Allergies  Allergen Reactions   Penicillins Hives    Immunization History  Administered Date(s) Administered   Influenza-Unspecified 03/11/2020, 03/28/2021   PFIZER(Purple Top)SARS-COV-2 Vaccination 09/20/2019, 10/14/2019, 05/22/2020    Past Medical History:  Diagnosis Date   Allergy    DVT (deep venous thrombosis) (HCC)    Migraine aura without headache 1998   PE (pulmonary thromboembolism) (HCC)     Tobacco History: Social History   Tobacco Use  Smoking Status Never   Passive exposure: Current  Smokeless Tobacco Never   Counseling given: Not Answered   Outpatient Medications Prior to Visit  Medication Sig Dispense Refill  albuterol (VENTOLIN HFA) 108 (90 Base) MCG/ACT inhaler Inhale 2 puffs into the lungs as needed for wheezing or shortness of breath.     diphenhydrAMINE-APAP, sleep, (TYLENOL PM EXTRA STRENGTH PO) Take by mouth.     Fluticasone -Umeclidin-Vilant (TRELEGY ELLIPTA ) 200-62.5-25 MCG/ACT AEPB Inhale 1  puff into the lungs daily. 1 each 5   mupirocin ointment (BACTROBAN) 2 % Apply topically 3 (three) times daily.     Spacer/Aero-Hold Chamber Bags MISC Use spacer with albuterol and Advair inhalers. 1 each 0   triamcinolone cream (KENALOG) 0.1 % Apply topically.     famotidine  (PEPCID ) 20 MG tablet One after supper (Patient not taking: Reported on 03/10/2024) 30 tablet 11   pantoprazole  (PROTONIX ) 40 MG tablet TAKE 1 TABLET (40 MG TOTAL) BY MOUTH DAILY. TAKE 30-60 MIN BEFORE FIRST MEAL OF THE DAY (Patient not taking: Reported on 03/10/2024) 30 tablet 2   VITAMIN D3 10 MCG (400 UNIT) tablet Take 800 Units by mouth daily. (Patient not taking: Reported on 03/10/2024)     cetirizine (ZYRTEC) 10 MG tablet Take 10 mg by mouth as needed. (Patient not taking: Reported on 03/10/2024)     No facility-administered medications prior to visit.     Review of Systems:   Constitutional:   No  weight loss, night sweats,  Fevers, chills, fatigue, or  lassitude.  HEENT:   No headaches,  Difficulty swallowing,  Tooth/dental problems, or  Sore throat,                No sneezing, itching, ear ache, nasal congestion, post nasal drip,   CV:  No chest pain,  Orthopnea, PND, swelling in lower extremities, anasarca, dizziness, palpitations, syncope.   GI  No heartburn, indigestion, abdominal pain, nausea, vomiting, diarrhea, change in bowel habits, loss of appetite, bloody stools.   Resp: No shortness of breath with exertion or at rest.  No excess mucus, no productive cough,  No non-productive cough,  No coughing up of blood.  No change in color of mucus.  No wheezing.  No chest wall deformity  Skin: no rash or lesions.  GU: no dysuria, change in color of urine, no urgency or frequency.  No flank pain, no hematuria   MS:  No joint pain or swelling.  No decreased range of motion.  No back pain.    Physical Exam  LMP 05/10/2011   GEN: A/Ox3; pleasant , NAD, well nourished    HEENT:  [place]/AT,  EACs-clear,  TMs-wnl, NOSE-clear, THROAT-clear, no lesions, no postnasal drip or exudate noted.   NECK:  Supple w/ fair ROM; no JVD; normal carotid impulses w/o bruits; no thyromegaly or nodules palpated; no lymphadenopathy.    RESP  Clear  P & A; w/o, wheezes/ rales/ or rhonchi. no accessory muscle use, no dullness to percussion  CARD:  RRR, no m/r/g, no peripheral edema, pulses intact, no cyanosis or clubbing.  GI:   Soft & nt; nml bowel sounds; no organomegaly or masses detected.   Musco: Warm bil, no deformities or joint swelling noted.   Neuro: alert, no focal deficits noted.    Skin: Warm, no lesions or rashes    Lab Results:  CBC    Component Value Date/Time   WBC 6.6 01/15/2024 1329   RBC 4.77 01/15/2024 1329   HGB 11.8 (L) 01/15/2024 1329   HGB 11.7 (L) 02/22/2023 1312   HGB 11.6 02/06/2023 1016   HCT 36.7 01/15/2024 1329   HCT 36.6 02/06/2023 1016   PLT 417.0 (H)  01/15/2024 1329   PLT 420 (H) 02/22/2023 1312   PLT 450 02/06/2023 1016   MCV 76.9 (L) 01/15/2024 1329   MCV 77 (L) 02/06/2023 1016   MCH 24.5 (L) 02/22/2023 1312   MCHC 32.1 01/15/2024 1329   RDW 14.9 01/15/2024 1329   RDW 14.4 02/06/2023 1016   LYMPHSABS 2.7 01/15/2024 1329   MONOABS 0.4 01/15/2024 1329   EOSABS 0.4 01/15/2024 1329   BASOSABS 0.1 01/15/2024 1329    BMET    Component Value Date/Time   NA 141 01/15/2024 1329   K 3.9 01/15/2024 1329   CL 106 01/15/2024 1329   CO2 26 01/15/2024 1329   GLUCOSE 101 (H) 01/15/2024 1329   BUN 12 01/15/2024 1329   CREATININE 0.98 01/15/2024 1329   CREATININE 0.98 02/22/2023 1312   CREATININE 1.06 10/30/2014 1316   CALCIUM 9.4 01/15/2024 1329   GFRNONAA >60 02/22/2023 1312   GFRAA 61 (L) 03/22/2012 1231    BNP No results found for: BNP  ProBNP No results found for: PROBNP  Imaging: No results found.  Administration History     None          Latest Ref Rng & Units 11/02/2022    2:41 PM  PFT Results  FVC-Pre L 2.04   FVC-Predicted Pre  % 71   FVC-Post L 2.43   FVC-Predicted Post % 85   Pre FEV1/FVC % % 70   Post FEV1/FCV % % 74   FEV1-Pre L 1.43   FEV1-Predicted Pre % 65   FEV1-Post L 1.81   DLCO uncorrected ml/min/mmHg 19.13   DLCO UNC% % 106   DLCO corrected ml/min/mmHg 19.13   DLCO COR %Predicted % 106   DLVA Predicted % 115   TLC L 4.42   TLC % Predicted % 96   RV % Predicted % 140     No results found for: NITRICOXIDE      Assessment & Plan:   No problem-specific Assessment & Plan notes found for this encounter.  Assessment and Plan Assessment & Plan Obstructive sleep apnea   Moderate obstructive sleep apnea is confirmed by a home sleep study with an AHI of 21.3 events per hour and oxygen saturation dropping to 83%. Symptoms include nocturnal awakenings and daytime fatigue. Order a CPAP machine and discuss its benefits and usage. Consider weight loss interventions as a long-term solution.  Asthma   Asthma symptoms have improved after resuming Trelegy. Previous wheezing and bronchospasm during laughter or excitement have resolved since restarting the medication. Trelegy cost has decreased significantly with Medicare. Continue Trelegy for asthma management.  Obesity   BMI is 32, indicating obesity. Discussed the potential impact of weight loss on improving sleep apnea and overall health. She is open to exploring new options, including weight loss injections. Discuss weight loss strategies, including potential use of weight loss injections, and monitor weight and BMI.  Right cervical lymphadenopathy   Intermittent swelling and pain in the right cervical region occur with no signs of infection or salivary gland stones. A recent ultrasound was unremarkable. An ENT referral is made, awaiting confirmation. Follow up with ENT for further evaluation and monitor symptoms, providing support as needed.     Madelin Stank, NP 03/10/2024

## 2024-03-10 NOTE — Patient Instructions (Addendum)
 Continue on Trelegy 1 puff daily  Albuterol inhaler As needed  use with spacer .  Zyrtec 10mg  daily As needed  drainage.   Begin CPAP At bedtime-wear all night long for at least 6hr or more.  Work on healthy weight loss.   Follow up with in 3 months and As needed

## 2024-03-17 DIAGNOSIS — K118 Other diseases of salivary glands: Secondary | ICD-10-CM | POA: Diagnosis not present

## 2024-03-17 DIAGNOSIS — K1120 Sialoadenitis, unspecified: Secondary | ICD-10-CM | POA: Diagnosis not present

## 2024-04-07 DIAGNOSIS — G4733 Obstructive sleep apnea (adult) (pediatric): Secondary | ICD-10-CM | POA: Diagnosis not present

## 2024-06-30 ENCOUNTER — Other Ambulatory Visit: Payer: Self-pay | Admitting: Adult Health

## 2024-06-30 ENCOUNTER — Encounter: Payer: Self-pay | Admitting: Adult Health

## 2024-06-30 ENCOUNTER — Ambulatory Visit: Admitting: Adult Health

## 2024-06-30 VITALS — BP 122/70 | HR 85 | Temp 98.2°F | Ht 61.0 in | Wt 171.0 lb

## 2024-06-30 DIAGNOSIS — Z139 Encounter for screening, unspecified: Secondary | ICD-10-CM

## 2024-06-30 DIAGNOSIS — G4733 Obstructive sleep apnea (adult) (pediatric): Secondary | ICD-10-CM

## 2024-06-30 DIAGNOSIS — I2699 Other pulmonary embolism without acute cor pulmonale: Secondary | ICD-10-CM

## 2024-06-30 DIAGNOSIS — Z86711 Personal history of pulmonary embolism: Secondary | ICD-10-CM | POA: Diagnosis not present

## 2024-06-30 DIAGNOSIS — E669 Obesity, unspecified: Secondary | ICD-10-CM | POA: Diagnosis not present

## 2024-06-30 DIAGNOSIS — J45909 Unspecified asthma, uncomplicated: Secondary | ICD-10-CM | POA: Diagnosis not present

## 2024-06-30 DIAGNOSIS — J4530 Mild persistent asthma, uncomplicated: Secondary | ICD-10-CM

## 2024-06-30 DIAGNOSIS — Z6832 Body mass index (BMI) 32.0-32.9, adult: Secondary | ICD-10-CM

## 2024-06-30 MED ORDER — ZEPBOUND 2.5 MG/0.5ML ~~LOC~~ SOAJ: 2.5000 mg | SUBCUTANEOUS | 0 refills | Status: AC

## 2024-06-30 NOTE — Progress Notes (Signed)
 "  @Patient  ID: Christy [name], female    DOB: 01/14/1961, 64 y.o.   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 This showed a small acute nonocclusive subsegmental pulmonary embolism. In the right lower lobe. An unchanged mixed solid and subsolid nodule in the left upper lobe dating back to 2018 consistent with a benign etiology.    Venous Doppler showed a right lower extremity DVT.    2D echo October 04, 2022 showed EF at 60 to 65%, grade 1 diastolic dysfunction, normal pulmonary artery systolic pressure   pulmonary function testing that shows moderate airflow obstruction and restriction. And significant reversibility. Prebronchodilator FEV1 was 65%, ratio 70, FVC 71%, postbronchodilator FEV1 82%, ratio 74, FVC 85%, 26% bronchodilator change, diffusing capacity 106%.   home sleep study conducted on January 22, 2024, revealed moderate sleep apnea with an AHI of 21.3/hour and a low SpO2 of 83%    FH : Positive for VTE and Lupus (2 kids and grandaughter)   Discussed the use of AI scribe software for clinical note transcription with the patient, who gave verbal consent to proceed.  History of Present Illness Christy [name] is a 64 year old female with sleep apnea and asthma who presents for a checkup regarding her CPAP use and asthma management.  She has moderate sleep apnea diagnosed after a sleep  study in August. She was started on CPAP therapy, which she finds challenging to adjust to, particularly due to mask leaks when she sleeps on her side. She has been using the CPAP for about six and a half to seven hours per night and feels better since starting to use.  CPAP download shows good compliance with daily average usage at 7 hours.  She is on auto CPAP 5 to 15 cm H2O.  AHI 4.4/hour daily average pressure at 12.5 cm H2O.  Her asthma is well-controlled on Trelegy, which is now covered by her insurance at a reduced cost of $47 per month. 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 She is considering follow-up with another ENT within her network.  She has a history of weight gain and is exploring weight loss options. She has a BMI of 32   She has started walking, achieving about 5,000 steps a day, and is working on increasing her physical activity. She maintains a food diary and has consulted a nutritionist, who did not find any major dietary issues. She has not lost any weight. Very frustrated that her weight continues to go up  Her family history includes two daughters with lupus, one of whom received a kidney donation from her but still requires dialysis. She has been tested for lupus and does not have it. 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 Recent labs showed normal function.   No history of pancreatitis, liver disease, thyroid  cancer, or MENS.      Allergies[1]  Immunization History  Administered Date(s) Administered   Influenza-Unspecified 03/11/2020, 03/28/2021   PFIZER(Purple Top)SARS-COV-2 Vaccination 09/20/2019, 10/14/2019, 05/22/2020    Past Medical History:  Diagnosis Date   Allergy    DVT (deep venous thrombosis) (HCC)    Migraine aura without headache 1998   PE (pulmonary  thromboembolism) (HCC)     Tobacco History: Tobacco Use History[2] Counseling given: Not Answered   Outpatient Medications Prior to Visit  Medication Sig Dispense Refill   albuterol (VENTOLIN HFA) 108 (90 Base) MCG/ACT inhaler Inhale 2 puffs into the lungs as needed for wheezing or shortness of breath.     diphenhydrAMINE-APAP, sleep, (TYLENOL PM EXTRA STRENGTH PO) Take by mouth. (Patient taking differently: Take by mouth daily as needed.)     Fluticasone -Umeclidin-Vilant (TRELEGY ELLIPTA ) 200-62.5-25 MCG/ACT AEPB Inhale 1 puff into the lungs daily. 1 each 5   famotidine  (PEPCID ) 20 MG tablet One after supper (Patient not taking: Reported on 06/30/2024) 30 tablet 11   mupirocin ointment (BACTROBAN) 2 % Apply topically 3 (three) times daily. (Patient not taking: Reported on 06/30/2024)     pantoprazole  (PROTONIX ) 40 MG tablet TAKE 1 TABLET (40 MG TOTAL) BY MOUTH DAILY. TAKE 30-60 MIN BEFORE FIRST MEAL OF THE DAY (Patient not taking: Reported on 06/30/2024) 30 tablet 2   Spacer/Aero-Hold Chamber Bags MISC Use spacer with albuterol and Advair inhalers. (Patient not taking: Reported on 06/30/2024) 1 each 0   VITAMIN D3 10 MCG (400 UNIT) tablet Take 800 Units by mouth daily. (Patient not taking: Reported on 06/30/2024)     No facility-administered medications prior to visit.     Review of Systems:   Constitutional:   No  weight loss, night sweats,  Fevers, chills, fatigue, or  lassitude.  HEENT:   No headaches,  Difficulty swallowing,  Tooth/dental problems, or  Sore throat,                No sneezing, itching, ear ache, nasal congestion, post nasal drip,   CV:  No chest pain,  Orthopnea, PND, swelling in lower extremities, anasarca, dizziness, palpitations, syncope.   GI  No heartburn, indigestion, abdominal pain, nausea, vomiting, diarrhea, change in bowel habits, loss of appetite, bloody stools.   Resp: No shortness of breath with exertion or at rest.  No excess mucus, no productive cough,   No non-productive cough,  No coughing up of blood.  No change in color of mucus.  No wheezing.  No chest wall deformity  Skin: no rash or lesions.  GU: no dysuria, change in color of urine, no urgency or frequency.  No flank pain, no hematuria   MS:  No joint pain or swelling.  No decreased range of motion.  No back pain.    Physical Exam  BP 122/70   Pulse 85   Temp 98.2 F (36.8 C)   Ht 5' 1 (1.549 m) Comment: Per pt  Wt 171 lb (77.6 kg)   LMP 05/10/2011   SpO2 93% Comment: RA  BMI 32.31 kg/m   GEN: A/Ox3; pleasant , NAD, well nourished    HEENT:  [place]/AT,   , NOSE-clear, THROAT-clear, no lesions, no postnasal drip or exudate noted.   NECK:  Supple w/ fair ROM; no JVD; normal carotid impulses w/o bruits; no thyromegaly or nodules palpated; no lymphadenopathy.    RESP  Clear  P & A; w/o, wheezes/ rales/ or rhonchi. no accessory muscle use, no dullness to percussion  CARD:  RRR, no m/r/g, no peripheral edema, pulses intact, no cyanosis or clubbing.  GI:   Soft & nt; nml bowel sounds; no organomegaly or masses detected.   Musco: Warm bil, no deformities or joint swelling noted.   Neuro: alert, no focal deficits noted.    Skin: Warm, no lesions or rashes    Lab Results:Reviewed 06/30/2024   CBC    Component Value Date/Time   WBC 6.6 01/15/2024 1329   RBC 4.77 01/15/2024 1329   HGB 11.8 (L) 01/15/2024 1329   HGB 11.7 (L) 02/22/2023 1312   HGB 11.6 02/06/2023 1016   HCT 36.7 01/15/2024 1329   HCT 36.6 02/06/2023 1016   PLT 417.0 (H) 01/15/2024 1329   PLT 420 (H) 02/22/2023 1312   PLT 450 02/06/2023 1016   MCV 76.9 (L) 01/15/2024 1329   MCV 77 (L) 02/06/2023 1016   MCH 24.5 (L) 02/22/2023 1312   MCHC 32.1 01/15/2024 1329   RDW 14.9 01/15/2024 1329   RDW 14.4 02/06/2023 1016   LYMPHSABS 2.7 01/15/2024 1329   MONOABS 0.4 01/15/2024 1329   EOSABS 0.4 01/15/2024 1329   BASOSABS 0.1 01/15/2024 1329    BMET    Component Value Date/Time   NA 141 01/15/2024  1329   K 3.9 01/15/2024 1329   CL 106 01/15/2024 1329   CO2 26 01/15/2024 1329   GLUCOSE 101 (H) 01/15/2024 1329   BUN 12 01/15/2024 1329   CREATININE 0.98 01/15/2024 1329   CREATININE 0.98 02/22/2023 1312   CREATININE 1.06 10/30/2014 1316   CALCIUM 9.4 01/15/2024 1329   GFRNONAA >60 02/22/2023 1312   GFRAA 61 (L) 03/22/2012 1231    BNP No results found for: BNP  ProBNP No results found for: PROBNP  Imaging: No results found.  Administration History     None          Latest Ref Rng & Units 11/02/2022    2:41 PM  PFT Results  FVC-Pre L 2.04   FVC-Predicted Pre % 71   FVC-Post L 2.43   FVC-Predicted Post % 85   Pre FEV1/FVC % % 70   Post FEV1/FCV % % 74   FEV1-Pre L 1.43   FEV1-Predicted Pre % 65   FEV1-Post L 1.81   DLCO uncorrected ml/min/mmHg 19.13   DLCO UNC% % 106   DLCO corrected ml/min/mmHg 19.13   DLCO COR %Predicted % 106   DLVA Predicted % 115   TLC L 4.42   TLC % Predicted % 96   RV % Predicted % 140     No results found for: NITRICOXIDE      No data to display              Assessment & Plan:   Assessment and Plan Assessment & Plan Obstructive sleep apnea  -well controlled and compliant on CPAP  Moderate obstructive sleep apnea was diagnosed via sleep study in August. She is currently using CPAP with good control and adherence but reports difficulty with mask fit when sleeping on her side, leading to leaks. Switching back to a nasal mask for CPAP is recommended. CPAP equipment should be cleaned regularly with distilled water. Patient has moderate  sleep apnea related to obesity with BMI >/30  posing significant cardiovascular risks. Patient has failed traditional weight loss measures with caloric deficit and consistent exercise of 150 min/week for >/6 months. Patient will be  initiated on Zepbound  (tirzepatide ) for weight management. Zepbound  is the only pharmaceutical treatment approved for moderate-to-severe OSA in adults who are  overweight (BMI >/27) or obese (BMI >/30). The patient will continue lifestyle modifications, including structured nutrition and physical activity as directed. No other GLP1 therapy will be used simultaneously at this time. The patient does not have any FDA labeled contraindications to this agent, including pregnancy, lactation, hx or family history of medullary thyroid  cancer, or multiple endocrine neoplasia type II. Side effect profile has been reviewed with patient. Aware of red flag symptoms to notify of immediately or seek emergency care, including severe nausea/vomiting, inability to pass bowels or gas, severe abdominal pain/tenderness, jaundice.  Begin Zepbound  2.5mg  injection weekly.   Asthma  -well controlled  Asthma is well-controlled with Trelegy. A breathing test confirms the diagnosis, and the current medication regimen is effective. Continue Trelegy as prescribed.  Obesity  -ongoing weight gain -unsuccessful with current diet and exercise With a BMI of 32, the potential benefits of weight loss on sleep apnea and overall health were discussed. Zepbound  (tirzepatide ) was prescribed at 2.5 mg weekly  emphasizing that it is part of a comprehensive plan including lifestyle modifications such as diet and exercise. Potential side effects include nausea, constipation, and gastrointestinal issues.   Increased physical activity to 30 minutes daily and dietary modifications to reduce carbohydrate intake and increase protein are encouraged. Monitor for side effects of Zepbound , including gastrointestinal issues, and check kidney function and Lipid panel if Zepbound  is started on return   Screening due   A mammogram screening is due. A 3D mammogram screening was ordered as the previous mammogram was not completed this year.  Parotid mass-on going continue follow-up with ENT  History of PE and DVT-seen by hematology.  Patient was treated with 6 months of Xarelto .        Warnell Rasnic,  NP 06/30/2024  I spent  45  minutes dedicated to the care of this patient on the date of this encounter to include pre-visit review of records, face-to-face time with the patient discussing conditions above, post visit ordering of testing, clinical documentation with the electronic health record, making appropriate referrals as documented, and communicating necessary findings to members of the patients care team.      [1]  Allergies Allergen Reactions   Penicillins Hives  [2]  Social History Tobacco Use  Smoking Status Never   Passive exposure: Current  Smokeless Tobacco Never   "

## 2024-06-30 NOTE — Patient Instructions (Addendum)
 Continue on Trelegy 1 puff daily  Albuterol inhaler As needed  use with spacer .  Zyrtec 10mg  daily As needed  drainage.   Continue on CPAP At bedtime-wear all night long for at least 6hr or more.  Work on healthy weight loss.  Keep up good work.   Begin Zepbound  injections 2.5mg  weekly .  Healthy diet and exercise   Establish with PCP -[facility].   Follow up with in 4-6 weeks and As needed   Check labs on return if start on Zepbound .    Notify your provider if you are planning to have a procedure/surgery, as this medication will need to be stopped prior.

## 2024-07-03 ENCOUNTER — Other Ambulatory Visit (HOSPITAL_COMMUNITY): Payer: Self-pay

## 2024-07-03 ENCOUNTER — Telehealth: Payer: Self-pay

## 2024-07-03 NOTE — Telephone Encounter (Signed)
 Pharmacy Patient Advocate Encounter  Received notification from [employer] that Prior Authorization for Zepbound  2.5mg  pen has been APPROVED from 07/03/2024 to 06/18/2025. Ran test claim, Copay is $692.77. This test claim was processed through [facility]- copay amounts may vary at other pharmacies due to pharmacy/plan contracts, or as the patient moves through the different stages of their insurance plan.   Patient has a deductible to meet causing higher pricing, may benefit from [facility] Payment Plan (patient must contact plan to sign up)

## 2024-07-03 NOTE — Telephone Encounter (Signed)
*  Pulm  Pharmacy Patient Advocate Encounter   Received notification from Pt Calls Messages that prior authorization for Zepbound  2.5mg  pen is required/requested.   Insurance verification completed.   The patient is insured through [facility].   Per test claim: PA required; PA submitted to above mentioned insurance via Latent Key/confirmation #/EOC [id number] Status is pending

## 2024-07-03 NOTE — Telephone Encounter (Signed)
 Not currently alternatives for sleep apnea.  That price is what Insurance has set  We can try later in the year to see if can get coverage-see if more affordable

## 2024-07-03 NOTE — Telephone Encounter (Signed)
ATC x 1 

## 2024-07-03 NOTE — Telephone Encounter (Signed)
 Copied from CRM #[id number]. Topic: Clinical - Prescription Issue >> Jun 30, 2024  1:55 PM Russell PARAS wrote: Reason for CRM:   Pt is contacting clinic regarding her prescribed Zepbound . She was advised by the pharmacy that her copay will be over $1000/month. She is unable to afford this and would like to know if there are any alternate medications or if PA for Zepbound  can be submitted.  Requested call back   CB#  [phone]   Tammy, please advise

## 2024-07-09 NOTE — Telephone Encounter (Signed)
 ATC x2

## 2024-07-10 NOTE — Telephone Encounter (Signed)
 Mychart message sent

## 2024-08-07 ENCOUNTER — Ambulatory Visit: Admitting: Adult Health
